# Patient Record
Sex: Male | Born: 1965 | Hispanic: Yes | Marital: Single | Smoking: Current every day smoker
Health system: Southern US, Community
[De-identification: ages and names within clinical notes are randomized; demographics above are authoritative.]

## PROBLEM LIST (undated history)

## (undated) DIAGNOSIS — E7800 Pure hypercholesterolemia, unspecified: Secondary | ICD-10-CM

## (undated) DIAGNOSIS — E119 Type 2 diabetes mellitus without complications: Secondary | ICD-10-CM

## (undated) DIAGNOSIS — I10 Essential (primary) hypertension: Secondary | ICD-10-CM

## (undated) HISTORY — PX: SHOULDER ARTHROSCOPY: SHX128

---

## 2016-04-24 DIAGNOSIS — Z6841 Body Mass Index (BMI) 40.0 and over, adult: Secondary | ICD-10-CM | POA: Insufficient documentation

## 2016-04-24 DIAGNOSIS — E119 Type 2 diabetes mellitus without complications: Secondary | ICD-10-CM | POA: Insufficient documentation

## 2016-04-24 DIAGNOSIS — E782 Mixed hyperlipidemia: Secondary | ICD-10-CM | POA: Insufficient documentation

## 2016-04-24 DIAGNOSIS — I10 Essential (primary) hypertension: Secondary | ICD-10-CM | POA: Insufficient documentation

## 2018-01-22 DIAGNOSIS — M2669 Other specified disorders of temporomandibular joint: Secondary | ICD-10-CM | POA: Insufficient documentation

## 2018-01-22 DIAGNOSIS — L299 Pruritus, unspecified: Secondary | ICD-10-CM | POA: Insufficient documentation

## 2018-01-22 DIAGNOSIS — H9203 Otalgia, bilateral: Secondary | ICD-10-CM | POA: Insufficient documentation

## 2020-01-14 ENCOUNTER — Other Ambulatory Visit: Payer: Self-pay

## 2020-01-14 ENCOUNTER — Encounter (HOSPITAL_COMMUNITY): Payer: Self-pay

## 2020-01-14 ENCOUNTER — Emergency Department (HOSPITAL_COMMUNITY): Payer: Self-pay

## 2020-01-14 ENCOUNTER — Emergency Department (HOSPITAL_COMMUNITY)
Admission: EM | Admit: 2020-01-14 | Discharge: 2020-01-14 | Payer: Self-pay | Attending: Emergency Medicine | Admitting: Emergency Medicine

## 2020-01-14 DIAGNOSIS — R079 Chest pain, unspecified: Secondary | ICD-10-CM

## 2020-01-14 DIAGNOSIS — R0602 Shortness of breath: Secondary | ICD-10-CM | POA: Insufficient documentation

## 2020-01-14 DIAGNOSIS — Z7984 Long term (current) use of oral hypoglycemic drugs: Secondary | ICD-10-CM | POA: Insufficient documentation

## 2020-01-14 DIAGNOSIS — R0789 Other chest pain: Secondary | ICD-10-CM | POA: Insufficient documentation

## 2020-01-14 DIAGNOSIS — R739 Hyperglycemia, unspecified: Secondary | ICD-10-CM

## 2020-01-14 DIAGNOSIS — I10 Essential (primary) hypertension: Secondary | ICD-10-CM | POA: Insufficient documentation

## 2020-01-14 DIAGNOSIS — E1165 Type 2 diabetes mellitus with hyperglycemia: Secondary | ICD-10-CM | POA: Insufficient documentation

## 2020-01-14 HISTORY — DX: Pure hypercholesterolemia, unspecified: E78.00

## 2020-01-14 HISTORY — DX: Essential (primary) hypertension: I10

## 2020-01-14 HISTORY — DX: Type 2 diabetes mellitus without complications: E11.9

## 2020-01-14 LAB — BASIC METABOLIC PANEL
Anion gap: 14 (ref 5–15)
BUN: 17 mg/dL (ref 6–20)
CO2: 23 mmol/L (ref 22–32)
Calcium: 9.6 mg/dL (ref 8.9–10.3)
Chloride: 98 mmol/L (ref 98–111)
Creatinine, Ser: 0.89 mg/dL (ref 0.61–1.24)
GFR calc Af Amer: >60 mL/min (ref >60)
GFR calc non Af Amer: >60 mL/min (ref >60)
Glucose, Bld: 312 mg/dL — ABNORMAL HIGH (ref 70–99)
Potassium: 4.4 mmol/L (ref 3.5–5.1)
Sodium: 135 mmol/L (ref 135–145)

## 2020-01-14 LAB — TROPONIN I (HIGH SENSITIVITY)
Troponin I (High Sensitivity): 7 ng/L (ref <18)
Troponin I (High Sensitivity): 8 ng/L (ref <18)

## 2020-01-14 LAB — CBC
HCT: 48.8 % (ref 39.0–52.0)
Hemoglobin: 16.4 g/dL (ref 13.0–17.0)
MCH: 28.9 pg (ref 26.0–34.0)
MCHC: 33.6 g/dL (ref 30.0–36.0)
MCV: 85.9 fL (ref 80.0–100.0)
Platelets: 247 10*3/uL (ref 150–400)
RBC: 5.68 MIL/uL (ref 4.22–5.81)
RDW: 14.1 % (ref 11.5–15.5)
WBC: 12.6 10*3/uL — ABNORMAL HIGH (ref 4.0–10.5)
nRBC: 0 % (ref 0.0–0.2)

## 2020-01-14 LAB — D-DIMER, QUANTITATIVE: D-Dimer, Quant: 0.29 ug/mL-FEU (ref 0.00–0.50)

## 2020-01-14 MED ORDER — SODIUM CHLORIDE 0.9 % IV BOLUS
1000.0000 mL | Freq: Once | INTRAVENOUS | Status: AC
  Administered 2020-01-14: 1000 mL via INTRAVENOUS

## 2020-01-14 MED ORDER — SODIUM CHLORIDE 0.9% FLUSH: 3.0000 mL | Freq: Once | INTRAVENOUS | Status: DC
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
## 2020-01-14 NOTE — Discharge Instructions (Addendum)
If you develop recurrent, continued, or worsening chest pain, shortness of breath, fever, vomiting, abdominal or back pain, or any other new/concerning symptoms then return to the ER for evaluation.  
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
## 2020-08-05 ENCOUNTER — Other Ambulatory Visit: Payer: Self-pay

## 2020-08-05 ENCOUNTER — Emergency Department (HOSPITAL_COMMUNITY)
Admission: EM | Admit: 2020-08-05 | Discharge: 2020-08-05 | Disposition: A | Discharge status: Home / Self Care | Payer: No Typology Code available for payment source | Attending: Emergency Medicine | Admitting: Emergency Medicine

## 2020-08-05 ENCOUNTER — Emergency Department (HOSPITAL_COMMUNITY): Payer: No Typology Code available for payment source

## 2020-08-05 ENCOUNTER — Encounter (HOSPITAL_COMMUNITY): Payer: Self-pay

## 2020-08-05 DIAGNOSIS — Y92410 Unspecified street and highway as the place of occurrence of the external cause: Secondary | ICD-10-CM | POA: Diagnosis not present

## 2020-08-05 DIAGNOSIS — R0789 Other chest pain: Secondary | ICD-10-CM | POA: Diagnosis not present

## 2020-08-05 DIAGNOSIS — I10 Essential (primary) hypertension: Secondary | ICD-10-CM | POA: Diagnosis not present

## 2020-08-05 DIAGNOSIS — E119 Type 2 diabetes mellitus without complications: Secondary | ICD-10-CM | POA: Insufficient documentation

## 2020-08-05 DIAGNOSIS — R109 Unspecified abdominal pain: Secondary | ICD-10-CM | POA: Diagnosis not present

## 2020-08-05 DIAGNOSIS — M5136 Other intervertebral disc degeneration, lumbar region: Secondary | ICD-10-CM | POA: Insufficient documentation

## 2020-08-05 DIAGNOSIS — M79605 Pain in left leg: Secondary | ICD-10-CM | POA: Diagnosis not present

## 2020-08-05 DIAGNOSIS — M5030 Other cervical disc degeneration, unspecified cervical region: Secondary | ICD-10-CM

## 2020-08-05 DIAGNOSIS — M79604 Pain in right leg: Secondary | ICD-10-CM | POA: Diagnosis not present

## 2020-08-05 DIAGNOSIS — M542 Cervicalgia: Secondary | ICD-10-CM | POA: Diagnosis present

## 2020-08-05 LAB — I-STAT CHEM 8, ED
BUN: 20 mg/dL (ref 6–20)
Calcium, Ion: 1.23 mmol/L (ref 1.15–1.40)
Chloride: 104 mmol/L (ref 98–111)
Creatinine, Ser: 0.7 mg/dL (ref 0.61–1.24)
Glucose, Bld: 138 mg/dL — ABNORMAL HIGH (ref 70–99)
HCT: 41 % (ref 39.0–52.0)
Hemoglobin: 13.9 g/dL (ref 13.0–17.0)
Potassium: 3.8 mmol/L (ref 3.5–5.1)
Sodium: 141 mmol/L (ref 135–145)
TCO2: 27 mmol/L (ref 22–32)

## 2020-08-05 LAB — COMPREHENSIVE METABOLIC PANEL
ALT: 17 U/L (ref 0–44)
AST: 16 U/L (ref 15–41)
Albumin: 4.1 g/dL (ref 3.5–5.0)
Alkaline Phosphatase: 63 U/L (ref 38–126)
Anion gap: 9 (ref 5–15)
BUN: 21 mg/dL — ABNORMAL HIGH (ref 6–20)
CO2: 27 mmol/L (ref 22–32)
Calcium: 9.3 mg/dL (ref 8.9–10.3)
Chloride: 105 mmol/L (ref 98–111)
Creatinine, Ser: 0.79 mg/dL (ref 0.61–1.24)
GFR, Estimated: >60 mL/min (ref >60)
Glucose, Bld: 141 mg/dL — ABNORMAL HIGH (ref 70–99)
Potassium: 4 mmol/L (ref 3.5–5.1)
Sodium: 141 mmol/L (ref 135–145)
Total Bilirubin: 0.6 mg/dL (ref 0.3–1.2)
Total Protein: 7.3 g/dL (ref 6.5–8.1)

## 2020-08-05 LAB — CBC
HCT: 42.6 % (ref 39.0–52.0)
Hemoglobin: 14.4 g/dL (ref 13.0–17.0)
MCH: 30 pg (ref 26.0–34.0)
MCHC: 33.8 g/dL (ref 30.0–36.0)
MCV: 88.8 fL (ref 80.0–100.0)
Platelets: 243 10*3/uL (ref 150–400)
RBC: 4.8 MIL/uL (ref 4.22–5.81)
RDW: 14 % (ref 11.5–15.5)
WBC: 10.5 10*3/uL (ref 4.0–10.5)
nRBC: 0 % (ref 0.0–0.2)

## 2020-08-05 LAB — LACTIC ACID, PLASMA: Lactic Acid, Venous: 1.4 mmol/L (ref 0.5–1.9)

## 2020-08-05 LAB — SAMPLE TO BLOOD BANK

## 2020-08-05 LAB — PROTIME-INR
INR: 0.9 (ref 0.8–1.2)
Prothrombin Time: 12 seconds (ref 11.4–15.2)

## 2020-08-05 LAB — ETHANOL: Alcohol, Ethyl (B): <10 mg/dL (ref <10)

## 2020-08-05 MED ORDER — CYCLOBENZAPRINE HCL 10 MG PO TABS: 10.0000 mg | ORAL_TABLET | Freq: Three times a day (TID) | ORAL | 0 refills | Status: DC | PRN

## 2020-08-05 MED ORDER — SODIUM CHLORIDE (PF) 0.9 % IJ SOLN
INTRAMUSCULAR | Status: AC
  Filled 2020-08-05: qty 50

## 2020-08-05 MED ORDER — FENTANYL CITRATE (PF) 100 MCG/2ML IJ SOLN
100.0000 ug | Freq: Once | INTRAMUSCULAR | Status: AC
  Administered 2020-08-05: 100 ug via INTRAVENOUS
  Filled 2020-08-05: qty 2

## 2020-08-05 MED ORDER — IOHEXOL 300 MG/ML  SOLN
100.0000 mL | Freq: Once | INTRAMUSCULAR | Status: AC | PRN
  Administered 2020-08-05: 100 mL via INTRAVENOUS

## 2020-08-05 NOTE — ED Notes (Signed)
C-collar applied in triage.

## 2020-08-05 NOTE — Discharge Instructions (Signed)
You have no traumatic injuries on your CT scans.   You do have some chronic degenerative changes in your neck/cervical spine.  You should discuss these finding with a neurosurgeon.  I've provided you with contact information.  Take medications as prescribed.  You can take Tylenol or Motrin for pain.  You can apply ice and heat for pain.
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
## 2021-06-18 ENCOUNTER — Ambulatory Visit (INDEPENDENT_AMBULATORY_CARE_PROVIDER_SITE_OTHER): Payer: 59 | Admitting: Medical

## 2021-06-18 ENCOUNTER — Telehealth: Payer: Self-pay | Admitting: Medical

## 2021-06-18 ENCOUNTER — Other Ambulatory Visit: Payer: Self-pay

## 2021-06-18 VITALS — BP 140/90 | HR 74 | Ht 73.0 in | Wt 287.0 lb

## 2021-06-18 DIAGNOSIS — Z125 Encounter for screening for malignant neoplasm of prostate: Secondary | ICD-10-CM | POA: Diagnosis not present

## 2021-06-18 DIAGNOSIS — F17200 Nicotine dependence, unspecified, uncomplicated: Secondary | ICD-10-CM | POA: Diagnosis not present

## 2021-06-18 DIAGNOSIS — M2550 Pain in unspecified joint: Secondary | ICD-10-CM

## 2021-06-18 DIAGNOSIS — E119 Type 2 diabetes mellitus without complications: Secondary | ICD-10-CM

## 2021-06-18 DIAGNOSIS — Z113 Encounter for screening for infections with a predominantly sexual mode of transmission: Secondary | ICD-10-CM

## 2021-06-18 DIAGNOSIS — Z0001 Encounter for general adult medical examination with abnormal findings: Secondary | ICD-10-CM | POA: Diagnosis not present

## 2021-06-18 DIAGNOSIS — R700 Elevated erythrocyte sedimentation rate: Secondary | ICD-10-CM

## 2021-06-18 DIAGNOSIS — M25522 Pain in left elbow: Secondary | ICD-10-CM | POA: Diagnosis not present

## 2021-06-18 DIAGNOSIS — Z0000 Encounter for general adult medical examination without abnormal findings: Secondary | ICD-10-CM

## 2021-06-18 DIAGNOSIS — E7849 Other hyperlipidemia: Secondary | ICD-10-CM

## 2021-06-18 DIAGNOSIS — Z1283 Encounter for screening for malignant neoplasm of skin: Secondary | ICD-10-CM

## 2021-06-18 DIAGNOSIS — I10 Essential (primary) hypertension: Secondary | ICD-10-CM | POA: Diagnosis not present

## 2021-06-18 LAB — COMPREHENSIVE METABOLIC PANEL
ALT: 15 U/L (ref 0–53)
AST: 12 U/L (ref 0–37)
Albumin: 4.2 g/dL (ref 3.5–5.2)
Alkaline Phosphatase: 75 U/L (ref 39–117)
BUN: 21 mg/dL (ref 6–23)
CO2: 28 mEq/L (ref 19–32)
Calcium: 9.7 mg/dL (ref 8.4–10.5)
Chloride: 100 mEq/L (ref 96–112)
Creatinine, Ser: 0.79 mg/dL (ref 0.40–1.50)
GFR: 99.59 mL/min (ref >60.00)
Glucose, Bld: 145 mg/dL — ABNORMAL HIGH (ref 70–99)
Potassium: 4.4 mEq/L (ref 3.5–5.1)
Sodium: 139 mEq/L (ref 135–145)
Total Bilirubin: 0.6 mg/dL (ref 0.2–1.2)
Total Protein: 7.2 g/dL (ref 6.0–8.3)

## 2021-06-18 LAB — LIPID PANEL
Cholesterol: 184 mg/dL (ref 0–200)
HDL: 39 mg/dL — ABNORMAL LOW (ref >39.00)
NonHDL: 144.76
Total CHOL/HDL Ratio: 5
Triglycerides: 218 mg/dL — ABNORMAL HIGH (ref 0.0–149.0)
VLDL: 43.6 mg/dL — ABNORMAL HIGH (ref 0.0–40.0)

## 2021-06-18 LAB — CBC WITH DIFFERENTIAL/PLATELET
Basophils Absolute: 0 10*3/uL (ref 0.0–0.1)
Basophils Relative: 0.4 % (ref 0.0–3.0)
Eosinophils Absolute: 0.3 10*3/uL (ref 0.0–0.7)
Eosinophils Relative: 4.2 % (ref 0.0–5.0)
HCT: 47 % (ref 39.0–52.0)
Hemoglobin: 15.6 g/dL (ref 13.0–17.0)
Lymphocytes Relative: 34.7 % (ref 12.0–46.0)
Lymphs Abs: 2.3 10*3/uL (ref 0.7–4.0)
MCHC: 33.2 g/dL (ref 30.0–36.0)
MCV: 88 fl (ref 78.0–100.0)
Monocytes Absolute: 0.5 10*3/uL (ref 0.1–1.0)
Monocytes Relative: 7.9 % (ref 3.0–12.0)
Neutro Abs: 3.6 10*3/uL (ref 1.4–7.7)
Neutrophils Relative %: 52.8 % (ref 43.0–77.0)
Platelets: 199 10*3/uL (ref 150.0–400.0)
RBC: 5.34 Mil/uL (ref 4.22–5.81)
RDW: 14.3 % (ref 11.5–15.5)
WBC: 6.8 10*3/uL (ref 4.0–10.5)

## 2021-06-18 LAB — PSA: PSA: 1.04 ng/mL (ref 0.10–4.00)

## 2021-06-18 LAB — HEMOGLOBIN A1C: Hgb A1c MFr Bld: 7.4 % — ABNORMAL HIGH (ref 4.6–6.5)

## 2021-06-18 LAB — C-REACTIVE PROTEIN: CRP: <1 mg/dL (ref 0.5–20.0)

## 2021-06-18 LAB — SEDIMENTATION RATE: Sed Rate: 32 mm/hr — ABNORMAL HIGH (ref 0–20)

## 2021-06-18 LAB — LDL CHOLESTEROL, DIRECT: Direct LDL: 124 mg/dL

## 2021-06-18 MED ORDER — METOPROLOL SUCCINATE ER 50 MG PO TB24: 50.0000 mg | ORAL_TABLET | Freq: Every day | ORAL | 3 refills | Status: DC

## 2021-06-18 MED ORDER — LISINOPRIL-HYDROCHLOROTHIAZIDE 20-25 MG PO TABS: 1.0000 | ORAL_TABLET | Freq: Every day | ORAL | 3 refills | Status: DC

## 2021-06-18 NOTE — Progress Notes (Signed)
Subjective:    Patient ID: Melvin Delgado, male    DOB: [birth year], 55 y.o.   MRN: [id number]  HPI  Pt in for follow up.   Pt changing providers. Last cpe/wellness about 2-3 years ago.   Pt works at [employer]. States [employer]. Pt not exercising. Eat moderate healthy. Pt smokes 5-6 cigarettes. Pt smoked for 36 years. On average 1 pack ever 2-3 days.    Pt has hx of htn and diabetes.   Pt had 3 covid vaccines in past.  Pt thinks he got a colonoscpy but he not remember when/where. Nor does he remember exact result.   For htn pt is on zestoretic 20-25 1 tab daily. Pt bp is elevated. No gross motor or sensory function deficits. Pt states sometimes bp is higher. One time190 sytolic and 110 diastolic. Last week. Had mild ha at that times. He checked various time and did go down to  176 systolic. Pt tell me he is on lisinopril hctz but has not been on metoprolol.   For diabetes pt is on metformin 850 mg bid.       Review of Systems  Constitutional:  Negative for fatigue and fever.  HENT:  Negative for congestion, drooling, ear pain, mouth sores and nosebleeds.   Respiratory:  Negative for cough, chest tightness, shortness of breath and wheezing.   Cardiovascular:  Negative for chest pain and palpitations.  Gastrointestinal:  Negative for abdominal pain, diarrhea and rectal pain.  Musculoskeletal:  Positive for arthralgias. Negative for back pain.       Handas, wrist and left elbow.  Pt states hands feel stiff and hurt in morning for a year of more.  Left elbow hurts a lot. Pain for 2 months.  Skin:  Negative for rash.  Neurological:  Negative for dizziness, weakness and headaches.  Hematological:  Negative for adenopathy. Does not bruise/bleed easily.  Psychiatric/Behavioral:  Negative for behavioral problems and confusion. The patient is not nervous/anxious.     Past Medical History:  Diagnosis Date   Diabetes mellitus without complication (HCC)    High  cholesterol    Hypertension      Social History   Socioeconomic History   Marital status: Single    Spouse name: Not on file   Number of children: Not on file   Years of education: Not on file   Highest education level: Not on file  Occupational History   Not on file  Tobacco Use   Smoking status: Not on file   Smokeless tobacco: Not on file  Substance and Sexual Activity   Alcohol use: Not on file   Drug use: Not on file   Sexual activity: Not on file  Other Topics Concern   Not on file  Social History Narrative   Not on file   Social Determinants of Health   Financial Resource Strain: Not on file  Food Insecurity: Not on file  Transportation Needs: Not on file  Physical Activity: Not on file  Stress: Not on file  Social Connections: Not on file  Intimate Partner Violence: Not on file    No past surgical history on file.  No family history on file.  No Known Allergies  Current Outpatient Medications on File Prior to Visit  Medication Sig Dispense Refill   lisinopril-hydrochlorothiazide (ZESTORETIC) 20-25 MG tablet Take by mouth.     cyclobenzaprine (FLEXERIL) 10 MG tablet Take 1 tablet (10 mg total) by mouth 3 (three) times daily as needed  for muscle spasms. 10 tablet 0   metFORMIN (GLUCOPHAGE) 850 MG tablet Take 850 mg by mouth 3 (three) times daily.     metoprolol succinate (TOPROL-XL) 50 MG 24 hr tablet Take 50 mg by mouth daily.     No current facility-administered medications on file prior to visit.    BP (!) 165/99   Pulse 74   Ht 6\' 1"  (1.854 m)   Wt 287 lb (130.2 kg)   SpO2 99%   BMI 37.87 kg/m        Objective:   Physical Exam   General Mental Status- Alert. General Appearance- Not in acute distress.   Skin Various and numerous moles all over back.  Neck Carotid Arteries- Normal color. Moisture- Normal Moisture. No carotid bruits. No JVD.  Chest and Lung Exam Auscultation: Breath  Sounds:-Normal.  Cardiovascular Auscultation:Rythm- Regular. Murmurs & Other Heart Sounds:Auscultation of the heart reveals- No Murmurs.  Abdomen Inspection:-Inspeection Normal. Palpation/Percussion:Note:No mass. Palpation and Percussion of the abdomen reveal- Non Tender, Non Distended + BS, no rebound or guarding.   Neurologic Cranial Nerve exam:- CN III-XII intact(No nystagmus), symmetric smile. Strength:- 5/5 equal and symmetric strength both upper and lower extremities.      Assessment & Plan:   Patient Instructions  For you wellness exam today I have ordered cbc, cmp, psa  lipid panel.  Flu vaccine declined.   Recommend exercise and healthy diet.  We will let you know lab results as they come in.  Follow up 7-10 days bp check or sooner if needed.  For htn I am refilling both your bp medications.   For diabetes continue metformin and getting a1c lab.   Please sign med release forms so can get date and copy of colonoscopy report and prior vaccine records.  Cuidados preventivos en los hombres de 40 a 64 aos de edad Preventive Care 8040-56 Years Old, Male Los cuidados preventivos hacen referencia a las opciones en cuanto al estilo de vida y a las visitas al mdico, las cuales pueden promover la salud y Counsellorel bienestar. Esto puede comprender lo siguiente: Un examen fsico anual. Esto tambin se conoce como visita de control de bienestar anual. Exmenes dentales y oculares de Inkstermanera regular. Vacunas. Estudios para Hospital doctordetectar ciertas enfermedades. Elecciones para un estilo de vida saludable, por ejemplo: Seguir una dieta saludable. Practicar actividad fsica con regularidad. No consumir drogas ni productos que contengan nicotina y tabaco. Limitar el consumo de bebidas alcohlicas. Qu puedo esperar para mi visita de cuidado preventivo? Examen fsico El mdico revisar lo siguiente: Diplomatic Services operational officerstatura y [place] Mountainpeso. Estos pueden usarse para calcular el IMC (ndice de masa corporal). El [facility]  es una medicin que indica si tiene un peso saludable. Frecuencia cardaca y presin arterial. Temperatura corporal. Piel para detectar manchas anormales. Asesoramiento Su mdico puede preguntarle acerca de: Problemas mdicos pasados. Antecedentes mdicos familiares. Consumo de tabaco, alcohol y drogas. Su bienestar emocional. Training and development officerBienestar en el hogar y las relaciones personales. Su actividad sexual. Hbitos de alimentacin, ejercicio y sueo. Su trabajo y [place] laboral. Acceso a armas de fuego. Qu vacunas necesito? Las vacunas se aplican a varias edades, segn un calendario. El Office Depotmdico le recomendar vacunas segn su edad, sus antecedentes mdicos, su estilo de vida y [address] factores, como los viajes o el lugar donde trabaja. Qu pruebas necesito? Anlisis de Praxairsangre Niveles de lpidos y colesterol. Estos se pueden verificar cada 5 aos, o ms a menudo, si usted tiene ms de 50 aos de edad. Anlisis de hepatitis C. Anlisis de  hepatitis B. Pruebas de deteccin Pruebas de deteccin de cncer de pulmn. Es posible que se le realice esta prueba de deteccin a partir de los 55 aos de edad, si ha fumado durante 30 aos un paquete diario y sigue fumando o dej el hbito en algn momento en los ltimos 15 aos. Examen de deteccin del cncer de prstata. Las recomendaciones variarn segn sus antecedentes familiares y Civil Service fast streamer. Examen de genitales para detectar cncer testicular o hernias. Pruebas de Airline pilot de Building services engineer. Todos los adultos a partir de los 50 aos de edad y [place] 75 aos de edad deben hacerse esta prueba de deteccin. El mdico puede recomendarle las pruebas de deteccin a partir de los 45 aos de edad si corre un mayor riesgo. Le realizarn pruebas cada 1 a 10 aos, segn los [place] y el tipo de prueba de Airline pilot. Pruebas de deteccin de la diabetes. Esto se Physiological scientist un control del azcar en la sangre (glucosa) despus de no haber comido  durante un periodo de tiempo (ayuno). Es posible que se le realice esta prueba cada 1 a 3 aos. Pruebas de enfermedades de transmisin sexual (ETS), si est en riesgo. Siga estas instrucciones en su casa: Comida y bebida  Siga una dieta que incluya frutas y verduras frescas, cereales integrales, protenas magras y productos lcteos descremados. Tome los suplementos vitamnicos y [employer] se lo haya indicado el mdico. No beba alcohol si el mdico se lo prohbe. Si bebe alcohol: Limite la cantidad que consume de 0 a 2 medidas por da. Est atento a la cantidad de alcohol que hay en las bebidas que toma. En los [address], una medida equivale a una botella de cerveza de 12 oz (355 ml), un vaso de vino de 5 oz (148 ml) o un vaso de una bebida alcohlica de alta graduacin de 1 oz (44 ml). Estilo de [employer] y las encas a diario. Cepllese los dientes a la maana y a la noche con pasta dental con fluoruro. Use hilo dental una vez al da. Mantngase activo. Haga al menos 30 minutos de ejercicio, 5 o ms [address]. No consuma ningn producto que contenga nicotina o tabaco, como cigarrillos, cigarrillos electrnicos y tabaco de Theatre manager. Si necesita ayuda para dejar de fumar, consulte al mdico. No consuma drogas. Si es sexualmente activo, practique sexo seguro. Use un condn u otra forma de proteccin para prevenir las ITS (infecciones de transmisin sexual). Si el mdico se lo indic, tome una dosis baja de aspirina diariamente a partir de los 50 aos de [place]. Encuentre formas saludables de lidiar con el estrs tales como: Meditacin, yoga o Optometrist. Lleve un diario personal. Hable con una persona confiable. Pase tiempo con amigos y familiares. Seguridad [place] siempre el cinturn de seguridad al conducir o viajar en un vehculo. No conduzca: Si ha estado bebiendo alcohol. No viaje con un conductor que ha estado bebiendo. Si est cansado o  distrado. Mientras est enviando mensajes de texto. Use un casco y otros equipos de proteccin durante las actividades deportivas. Si tiene armas de fuego en su casa, asegrese de seguir todos los procedimientos de seguridad correspondientes. Cundo volver? Acuda al mdico una vez al ao para una visita anual de control de bienestar. Pregntele al mdico con qu frecuencia debe realizarse un control de la vista y los dientes. Mantenga su esquema de vacunacin al da. Esta informacin no tiene Theme park manager el consejo del mdico. Asegrese de hacerle al  mdico cualquier pregunta que tenga. Document Revised: 09/01/2019 Document Reviewed: 09/01/2019 Elsevier Patient Education  2022 Elsevier Inc.     New pt with various medical problems. Did cpe/wellness and address complaints. Did take 47 minutes with pt.

## 2021-06-18 NOTE — Addendum Note (Signed)
Addended by: Rosita Kea on: 06/18/2021 10:49 AM   Modules accepted: Orders

## 2021-06-18 NOTE — Telephone Encounter (Signed)
LVM in spanish for pt if possible to come back today for labs- pt had appt today with provider but needed also to get a urine sample from pt, which was not done, and if pt can not come today, to schedule a lab appt at pt's convenience.

## 2021-06-18 NOTE — Patient Instructions (Addendum)
For you wellness exam today I have ordered cbc, cmp, psa  lipid panel.  Flu vaccine declined.   Recommend exercise and healthy diet.  We will let you know lab results as they come in.  Follow up 7-10 days bp check or sooner if needed.  For htn I am refilling both your bp medications.   For diabetes continue metformin and getting a1c lab.   Please sign med release forms so can get date and copy of colonoscopy report and prior vaccine records.  For joint pains will get inflammatory labs.  For left elbow pain will get xray. After review likely rever to sports medicine MD.   For smoking cessation plan to offer medication wellbutrin after next visit bp check.    Cuidados preventivos en los hombres de 40 a 64 aos de edad Preventive Care 20-61 Years Old, Male Los cuidados preventivos hacen referencia a las opciones en cuanto al estilo de vida y a las visitas al mdico, las cuales pueden promover la salud y Counsellor. Esto puede comprender lo siguiente: Un examen fsico anual. Esto tambin se conoce como visita de control de bienestar anual. Exmenes dentales y oculares de [place] regular. Vacunas. Estudios para Hospital doctor. Elecciones para un estilo de vida saludable, por ejemplo: Seguir una dieta saludable. Practicar actividad fsica con regularidad. No consumir drogas ni productos que contengan nicotina y tabaco. Limitar el consumo de bebidas alcohlicas. Qu puedo esperar para mi visita de cuidado preventivo? Examen fsico El mdico revisar lo siguiente: Diplomatic Services operational officer y [place]. Estos pueden usarse para calcular el IMC (ndice de masa corporal). El [facility] es una medicin que indica si tiene un peso saludable. Frecuencia cardaca y presin arterial. Temperatura corporal. Piel para detectar manchas anormales. Asesoramiento Su mdico puede preguntarle acerca de: Problemas mdicos pasados. Antecedentes mdicos familiares. Consumo de tabaco, alcohol y drogas. Su  bienestar emocional. Training and development officer y las relaciones personales. Su actividad sexual. Hbitos de alimentacin, ejercicio y sueo. Su trabajo y [place] laboral. Acceso a armas de fuego. Qu vacunas necesito? Las vacunas se aplican a varias edades, segn un calendario. El [employer] recomendar vacunas segn su edad, sus antecedentes mdicos, su estilo de vida y [address], como los viajes o el lugar donde trabaja. Qu pruebas necesito? Anlisis de [employer] de lpidos y colesterol. Estos se pueden verificar cada 5 aos, o ms a menudo, si usted tiene ms de 50 aos de edad. Anlisis de hepatitis C. Anlisis de hepatitis B. Pruebas de deteccin Pruebas de deteccin de cncer de pulmn. Es posible que se le realice esta prueba de deteccin a partir de los 55 aos de edad, si ha fumado durante 30 aos un paquete diario y sigue fumando o dej el hbito en algn momento en los ltimos 15 aos. Examen de deteccin del cncer de prstata. Las recomendaciones variarn segn sus antecedentes familiares y Civil Service fast streamer. Examen de genitales para detectar cncer testicular o hernias. Pruebas de Airline pilot de Building services engineer. Todos los adultos a partir de los 50 aos de edad y [place] 75 aos de edad deben hacerse esta prueba de deteccin. El mdico puede recomendarle las pruebas de deteccin a partir de los 45 aos de edad si corre un mayor riesgo. Le realizarn pruebas cada 1 a 10 aos, segn los [place] y el tipo de prueba de Airline pilot. Pruebas de deteccin de la diabetes. Esto se realiza mediante un control del azcar en la sangre (glucosa) despus de no haber comido durante un periodo de  tiempo (ayuno). Es posible que se le realice esta prueba cada 1 a 3 aos. Pruebas de enfermedades de transmisin sexual (ETS), si est en riesgo. Siga estas instrucciones en su casa: Comida y bebida  Siga una dieta que incluya frutas y verduras frescas, cereales integrales, protenas  magras y productos lcteos descremados. Tome los suplementos vitamnicos y [employer] se lo haya indicado el mdico. No beba alcohol si el mdico se lo prohbe. Si bebe alcohol: Limite la cantidad que consume de 0 a 2 medidas por da. Est atento a la cantidad de alcohol que hay en las bebidas que toma. En los [address], una medida equivale a una botella de cerveza de 12 oz (355 ml), un vaso de vino de 5 oz (148 ml) o un vaso de una bebida alcohlica de alta graduacin de 1 oz (44 ml). Estilo de [employer] y las encas a diario. Cepllese los dientes a la maana y a la noche con pasta dental con fluoruro. Use hilo dental una vez al da. Mantngase activo. Haga al menos 30 minutos de ejercicio, 5 o ms [address]. No consuma ningn producto que contenga nicotina o tabaco, como cigarrillos, cigarrillos electrnicos y tabaco de Theatre manager. Si necesita ayuda para dejar de fumar, consulte al mdico. No consuma drogas. Si es sexualmente activo, practique sexo seguro. Use un condn u otra forma de proteccin para prevenir las ITS (infecciones de transmisin sexual). Si el mdico se lo indic, tome una dosis baja de aspirina diariamente a partir de los 50 aos de [place]. Encuentre formas saludables de lidiar con el estrs tales como: Meditacin, yoga o Optometrist. Lleve un diario personal. Hable con una persona confiable. Pase tiempo con amigos y familiares. Seguridad [place] siempre el cinturn de seguridad al conducir o viajar en un vehculo. No conduzca: Si ha estado bebiendo alcohol. No viaje con un conductor que ha estado bebiendo. Si est cansado o distrado. Mientras est enviando mensajes de texto. Use un casco y otros equipos de proteccin durante las actividades deportivas. Si tiene armas de fuego en su casa, asegrese de seguir todos los procedimientos de seguridad correspondientes. Cundo volver? Acuda al mdico una vez al ao para una visita anual de control  de bienestar. Pregntele al mdico con qu frecuencia debe realizarse un control de la vista y los dientes. Mantenga su esquema de vacunacin al da. Esta informacin no tiene Theme park manager el consejo del mdico. Asegrese de hacerle al mdico cualquier pregunta que tenga. Document Revised: 09/01/2019 Document Reviewed: 09/01/2019 Elsevier Patient Education  2022 [employer].

## 2021-06-19 MED ORDER — ATORVASTATIN CALCIUM 10 MG PO TABS: 10.0000 mg | ORAL_TABLET | Freq: Every day | ORAL | 3 refills | Status: DC

## 2021-06-19 MED ORDER — SAXAGLIPTIN HCL 5 MG PO TABS: 5.0000 mg | ORAL_TABLET | Freq: Every day | ORAL | 3 refills | Status: DC

## 2021-06-19 NOTE — Addendum Note (Signed)
Addended by: Gwenevere Abbot on: 06/19/2021 09:13 AM   Modules accepted: Orders

## 2021-06-19 NOTE — Progress Notes (Signed)
Lvm (in spanish) for patient to call back about his lab resutls

## 2021-06-19 NOTE — Progress Notes (Signed)
Lvm (in spanish) for patient to call back about his lab resutls 

## 2021-06-20 LAB — ANTI-NUCLEAR AB-TITER (ANA TITER): ANA Titer 1: 1:80 {titer} — ABNORMAL HIGH

## 2021-06-20 LAB — RHEUMATOID FACTOR: Rheumatoid fact SerPl-aCnc: <14 IU/mL (ref <14)

## 2021-06-20 LAB — ANA: Anti Nuclear Antibody (ANA): POSITIVE — AB

## 2021-06-20 LAB — HIV ANTIBODY (ROUTINE TESTING W REFLEX): HIV 1&2 Ab, 4th Generation: NONREACTIVE

## 2021-06-22 ENCOUNTER — Telehealth: Payer: Self-pay

## 2021-06-22 ENCOUNTER — Telehealth: Payer: Self-pay | Admitting: Medical

## 2021-06-22 MED ORDER — DICLOFENAC SODIUM 75 MG PO TBEC: 75.0000 mg | DELAYED_RELEASE_TABLET | Freq: Two times a day (BID) | ORAL | 0 refills | Status: DC

## 2021-06-22 NOTE — Telephone Encounter (Signed)
Rx diclofenac sent to pharmacy.

## 2021-06-22 NOTE — Telephone Encounter (Signed)
Pt called in wanting to speak with you.

## 2021-06-25 NOTE — Telephone Encounter (Signed)
Patient moved from [place] scheduled to provider scheduled on 06-27-21 NV was at 9:15 provider's visit is at 9:20

## 2021-06-27 ENCOUNTER — Ambulatory Visit: Payer: 59

## 2021-06-27 ENCOUNTER — Ambulatory Visit: Payer: 59 | Admitting: Medical

## 2021-07-05 ENCOUNTER — Ambulatory Visit (INDEPENDENT_AMBULATORY_CARE_PROVIDER_SITE_OTHER): Payer: 59 | Admitting: Medical

## 2021-07-05 ENCOUNTER — Encounter: Payer: Self-pay | Admitting: Medical

## 2021-07-05 ENCOUNTER — Other Ambulatory Visit: Payer: Self-pay

## 2021-07-05 VITALS — BP 165/90 | HR 76 | Resp 18 | Ht 73.0 in | Wt 295.6 lb

## 2021-07-05 DIAGNOSIS — I10 Essential (primary) hypertension: Secondary | ICD-10-CM

## 2021-07-05 DIAGNOSIS — F17200 Nicotine dependence, unspecified, uncomplicated: Secondary | ICD-10-CM

## 2021-07-05 DIAGNOSIS — E119 Type 2 diabetes mellitus without complications: Secondary | ICD-10-CM | POA: Diagnosis not present

## 2021-07-05 DIAGNOSIS — R700 Elevated erythrocyte sedimentation rate: Secondary | ICD-10-CM

## 2021-07-05 DIAGNOSIS — M2550 Pain in unspecified joint: Secondary | ICD-10-CM

## 2021-07-05 MED ORDER — LISINOPRIL 20 MG PO TABS: 20.0000 mg | ORAL_TABLET | Freq: Every day | ORAL | 1 refills | Status: DC

## 2021-07-05 MED ORDER — TRAMADOL HCL 50 MG PO TABS
50.0000 mg | ORAL_TABLET | Freq: Three times a day (TID) | ORAL | 0 refills | Status: AC | PRN
Start: 2021-07-05 — End: 2021-07-10

## 2021-07-05 MED ORDER — GLIPIZIDE ER 5 MG PO TB24: 5.0000 mg | ORAL_TABLET | Freq: Every day | ORAL | 3 refills | Status: AC

## 2021-07-05 NOTE — Progress Notes (Signed)
Subjective:    Patient ID: Melvin Delgado, male    DOB: 01-10-1966, 55 y.o.   MRN: [id number]  HPI  Pt has wrist and hand pain for 4-6 months. Sed rate was elevated and rx'd diclofenac. Pt states daily has level 7/10 pain. Some times pain in rt elbow. Hand feel stiff in morning. I had placed referral to rheumatologist. Pt has appointment for October.   Pt is diabetic. A1c 7.4 recently. Pt is metformin and I had added onglyza. Pt was told price would be $500 per month. Pt thinks he may have been on glipizide in the past. No hx of hypolglycemia on meds per pt.  Pt bp is very high today. No cardiac or neurologic signs or symptoms. No caffeine today. Pt did smoke cigarrette this morning.  Thigh pain/numbness for years/82yr. No hip pain and no lumbar. Also some rt great toe numbness for one year.   Review of Systems  Constitutional:  Negative for chills, fatigue and fever.  Respiratory:  Negative for cough, chest tightness, shortness of breath and wheezing.   Cardiovascular:  Negative for chest pain and palpitations.  Gastrointestinal:  Negative for abdominal pain, nausea and vomiting.  Endocrine: Negative for polydipsia, polyphagia and polyuria.  Musculoskeletal:  Positive for arthralgias. Negative for back pain, myalgias and neck pain.  Skin:  Negative for rash.  Neurological:  Negative for dizziness, seizures, syncope, weakness, numbness and headaches.  Psychiatric/Behavioral:  Negative for behavioral problems and dysphoric mood.    Past Medical History:  Diagnosis Date   Diabetes mellitus without complication (HCC)    High cholesterol    Hypertension      Social History   Socioeconomic History   Marital status: Single    Spouse name: Not on file   Number of children: Not on file   Years of education: Not on file   Highest education level: Not on file  Occupational History   Not on file  Tobacco Use   Smoking status: Every Day    Packs/day: 0.50    Types: Cigarettes     Start date: 06/15/1985   Smokeless tobacco: Never  Substance and Sexual Activity   Alcohol use: Not on file   Drug use: Not on file   Sexual activity: Not on file  Other Topics Concern   Not on file  Social History Narrative   Not on file   Social Determinants of Health   Financial Resource Strain: Not on file  Food Insecurity: Not on file  Transportation Needs: Not on file  Physical Activity: Not on file  Stress: Not on file  Social Connections: Not on file  Intimate Partner Violence: Not on file    No past surgical history on file.  No family history on file.  No Known Allergies  Current Outpatient Medications on File Prior to Visit  Medication Sig Dispense Refill   atorvastatin (LIPITOR) 10 MG tablet Take 1 tablet (10 mg total) by mouth daily. 30 tablet 3   cyclobenzaprine (FLEXERIL) 10 MG tablet Take 1 tablet (10 mg total) by mouth 3 (three) times daily as needed for muscle spasms. 10 tablet 0   diclofenac (VOLTAREN) 75 MG EC tablet Take 1 tablet (75 mg total) by mouth 2 (two) times daily. 20 tablet 0   lisinopril-hydrochlorothiazide (ZESTORETIC) 20-25 MG tablet Take 1 tablet by mouth daily. 30 tablet 3   metFORMIN (GLUCOPHAGE) 850 MG tablet Take 850 mg by mouth 3 (three) times daily.     metoprolol succinate (TOPROL-XL)  50 MG 24 hr tablet Take 1 tablet (50 mg total) by mouth daily. 30 tablet 3   No current facility-administered medications on file prior to visit.    BP (!) 165/90   Pulse 76   Resp 18   Ht 6\' 1"  (1.854 m)   Wt 295 lb 9.6 oz (134.1 kg)   SpO2 94%   BMI 39.00 kg/m        Objective:   Physical Exam   General Mental Status- Alert. General Appearance- Not in acute distress.   Skin General: Color- Normal Color. Moisture- Normal Moisture.  Neck Carotid Arteries- Normal color. Moisture- Normal Moisture. No carotid bruits. No JVD.  Chest and Lung Exam Auscultation: Breath Sounds:-Normal.  Cardiovascular Auscultation:Rythm-  Regular. Murmurs & Other Heart Sounds:Auscultation of the heart reveals- No Murmurs.  Abdomen Inspection:-Inspeection Normal. Palpation/Percussion:Note:No mass. Palpation and Percussion of the abdomen reveal- Non Tender, Non Distended + BS, no rebound or guarding.   Neurologic Cranial Nerve exam:- CN III-XII intact(No nystagmus), symmetric smile. Strength:- 5/5 equal and symmetric strength both upper and lower extremities.      Assessment & Plan:   Patient Instructions  Hypertension not well controlled today.  Normal neurologic exam.  Continue lisinopril/HCTZ and adding additional lisinopril 20 mg to current dose.  Continue beta-blocker at same dose.  Check blood pressures daily at home and give me update in 5 to 7 days.  If you develop any cardiac or neurologic signs or symptoms and recommend ED evaluation.  History of diabetes with recent A1c of 7.4.  Continue metformin.  Unfortunately Onglyza was too expensive.  Prescription of glipizide sent to your pharmacy.  Rx advisement given regarding possible hypoglycemia.  Check blood sugars daily.  If symptomatic do additional check.  If any sugars less than 70 then eat food or beverage to increase sugar level.  Notify me of any low blood sugar events.  History of arthralgias with elevated sed rate.  Some response to diclofenac.  Since BP is elevated today discontinue diclofenac.  Also stop any over-the-counter NSAIDs.  Can use Tylenol presently for mild to moderate pain.  For moderate to severe pain making limited number of tramadol available.  Rx advisement given.  Rheumatology appointment in October.  Also advised/counseled on smoking cessation.  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  Follow-up in 3 weeks or sooner if needed.   November, PA-C

## 2021-07-05 NOTE — Patient Instructions (Addendum)
Hypertension not well controlled today.  Normal neurologic exam.  Continue lisinopril/HCTZ and adding additional lisinopril 20 mg to current dose.  Continue beta-blocker at same dose.  Check blood pressures daily at home and give me update in 5 to 7 days.  If you develop any cardiac or neurologic signs or symptoms and recommend ED evaluation.  History of diabetes with recent A1c of 7.4.  Continue metformin.  Unfortunately Onglyza was too expensive.  Prescription of glipizide sent to your pharmacy.  Rx advisement given regarding possible hypoglycemia.  Check blood sugars daily.  If symptomatic do additional check.  If any sugars less than 70 then eat food or beverage to increase sugar level.  Notify me of any low blood sugar events.  History of arthralgias with elevated sed rate.  Some response to diclofenac.  Since BP is elevated today discontinue diclofenac.  Also stop any over-the-counter NSAIDs.  Can use Tylenol presently for mild to moderate pain.  For moderate to severe pain making limited number of tramadol available.  Rx advisement given.  Rheumatology appointment in October.  Also advised/counseled on smoking cessation.  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 Could tramadol can help with nerve pain. May refer to sports med for lateral thigh numbness in future.  Follow-up in 3 weeks or sooner if needed.
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
## 2021-07-26 ENCOUNTER — Telehealth: Payer: Self-pay | Admitting: Medical

## 2021-07-26 ENCOUNTER — Other Ambulatory Visit: Payer: Self-pay

## 2021-07-26 ENCOUNTER — Ambulatory Visit: Payer: Self-pay

## 2021-07-26 ENCOUNTER — Encounter: Payer: Self-pay | Admitting: Internal Medicine

## 2021-07-26 ENCOUNTER — Ambulatory Visit: Payer: 59 | Admitting: Internal Medicine

## 2021-07-26 VITALS — BP 175/95 | HR 65 | Resp 16 | Ht 73.0 in | Wt 296.0 lb

## 2021-07-26 DIAGNOSIS — L910 Hypertrophic scar: Secondary | ICD-10-CM | POA: Insufficient documentation

## 2021-07-26 DIAGNOSIS — L309 Dermatitis, unspecified: Secondary | ICD-10-CM | POA: Insufficient documentation

## 2021-07-26 DIAGNOSIS — M79642 Pain in left hand: Secondary | ICD-10-CM | POA: Diagnosis not present

## 2021-07-26 DIAGNOSIS — M79641 Pain in right hand: Secondary | ICD-10-CM | POA: Diagnosis not present

## 2021-07-26 DIAGNOSIS — M25522 Pain in left elbow: Secondary | ICD-10-CM | POA: Diagnosis not present

## 2021-07-26 NOTE — Patient Instructions (Addendum)
Para la osteoartritis de Engineer, site, varios tratamientos pueden ser beneficiosos: - Los medicamentos antiinflamatorios no esteroideos orales como ibuprofeno, Training and development officer, celebrex o mobic son muy tiles para la osteoartritis, Biomedical engineer pueden causar efectos secundarios como lceras estomacales o hipertensin con el uso prolongado. Estos deben tomarse de forma intermitente o segn sea necesario, y siempre con alimentos.  - Se pueden aplicar medicamentos antiinflamatorios tpicos como diclofenaco o Voltaren en el rea afectada segn sea necesario, pero pueden ser menos efectivos que los medicamentos antiinflamatorios orales. Se pueden probar analgsicos tpicos que contengan CBD, mentol o lidocana. Se recomiendan los tratamientos que contienen capsaicina para la mano.  - Otros suplementos orales como la glucosamina condroitina que contiene tratamientos de venta libre como osteo bi-flex u otras marcas no tienen datos slidos que respalden la eficacia, pero pueden ser tiles para International aid/development worker y no tienen efectos secundarios importantes. La crcuma tiene Visual merchandiser similar a los medicamentos AINE y puede ayudar, si se toma como suplemento, no debe tomarse por encima de las dosis recomendadas.  - Los guantes de compresin pueden ser tiles para apoyar la articulacin del pulgar, especialmente si duele con ciertas actividades.  - La derivacin a terapia ocupacional puede discutir ejercicios o modificacin de actividades para mejorar los sntomas o la fuerza si es necesario.  - La inyeccin local de esteroides es una opcin si los sntomas empeoran y no se controlan con las opciones anteriores.  Codo de [place] Tennis Elbow El codo de [place] (epicondilitis lateral) es la inflamacin de los tendones en la zona externa del Alen Bleacher, cerca del codo. Los tendones son tejidos que Lawyer msculo al [employer]. Cuando tiene codo de [place], Community education officer a los tendones que se Insurance risk surveyor para Freight forwarder la  [place] y Scientist, forensic la mano [place] arriba. La inflamacin se produce en la parte inferior del hueso del brazo (hmero), en la que los tendones se conectan a los huesos (epicndilo lateral). A menudo, el codo de [place] afecta a aquellos que juegan al tenis, Biomedical engineer cualquier persona puede tener la afeccin si extiende la [place] o gira el antebrazo repetidas veces. Cules son las causas? Esta afeccin generalmente se debe a que extiende la [place] o gira el Product manager y [place] las manos de [place] reiterada. Puede ser consecuencia de la prctica de deportes o la realizacin de tareas que exigen movimientos repetitivos del Product manager. En algunos casos, la afeccin puede deberse a una lesin repentina. Qu incrementa el riesgo? Tiene ms probabilidades de tener codo de tenista si juega al tenis o practica otro deporte con raqueta. Adems, si [place] frecuentemente las manos para trabajar. Adems de aquellos que juegan al tenis, otras personas con mayor riesgo, incluyen: Usuarios de computadoras. Trabajadores de Paramedic. Trabajadores de fbricas. Msicos. Cocineros. Cajeros. Cules son los signos o sntomas? Los sntomas de esta afeccin incluyen: Dolor y sensibilidad a la palpacin en su antebrazo y la parte externa del codo. El Engineer, mining puede solo sentirse cuando use el brazo o puede sentirse todo Museum/gallery conservator. Una sensacin de ardor que comienza en la zona del codo y se extiende por el Product manager. Un agarre dbil en la mano. Cmo se diagnostica? Esta afeccin se diagnostica en funcin de sus sntomas, antecedentes mdicos y de un examen fsico. Adems, pueden tomarle radiografas o hacerle una resonancia magntica (RM) para: Confirmar el diagnstico. Buscar otros problemas. Verificar la presencia de Administrator, los msculos o los tendones. Cmo se trata? Descansar el brazo y Contractor hielo es Designer, jewellery.  El mdico tambin podr indicar: Medicamentos para reducir Chief Technology Officer y la  inflamacin. Estos pueden administrase en forma de pldora, gel tpico, o inyecciones de medicamentos con corticoesteroides (cortisona). Una correa para codo para reducir la presin sobre la zona. Fisioterapia. Este tratamiento puede incluir masajes o ejercicios, o ambos. Un dispositivo ortopdico para limitar los movimientos del codo que le causen sntomas. Si estos tratamientos no ayudan a [employer], el mdico puede recomendar una ciruga para extirpar el [employer] daado y Programme researcher, broadcasting/film/video a unir el msculo sano al [employer]. Siga estas instrucciones en su casa: Si tiene un dispositivo ortopdico o una correa: Use el dispositivo ortopdico o la correa como se lo haya indicado el mdico. Quteselos solamente como se lo haya indicado el mdico. Controle la piel alrededor del dispositivo ortopdico o la correa todos [place]. Informe al mdico acerca de cualquier inquietud. Afloje el dispositivo ortopdico si los dedos de las manos se le adormecen, siente hormigueos o se le enfran y se tornan de Research officer, trade union. Mantenga limpio el dispositivo ortopdico. Si el dispositivo ortopdico o la correa no son impermeables: No deje que se mojen. Cbralos con un envoltorio hermtico cuando tome un bao de inmersin o una ducha. Control del dolor, la rigidez y la hinchazn  Si se lo indican, aplique hielo sobre la zona de la lesin. Para hacer esto: Si tiene un dispositivo ortopdico desmontable o una correa, quteselo como se lo haya indicado el mdico. Ponga el hielo en una bolsa plstica. Coloque una toalla entre la piel y Copy. Aplique el hielo durante 20 minutos, 2 o 3 veces por da. Retire el hielo si la piel se pone de color rojo brillante. Esto es [employer]. Si no puede sentir dolor, calor o fro, tiene un mayor riesgo de que se dae la zona. Mueva los dedos con frecuencia para reducir la rigidez y la hinchazn. Actividad Descanse el codo y la [place], y evite las actividades que le causen  sntomas como se lo haya indicado el mdico. Haga los ejercicios de fisioterapia como se lo haya indicado el mdico. Si levanta un objeto, hgalo con la palma de la mano [place] arriba. Esto reduce la tensin en su codo. Estilo de vida Si el codo de [place] se debe a los deportes, revise el equipo y Manufacturing engineer de lo siguiente: Que lo [place] correctamente. Que es adecuado para usted. Si el codo de [place] se debe a su trabajo o al uso de una computadora, tmese descansos frecuentes para Sports coach. Hable con su empleador [employer] de controlar su afeccin en [place]. Instrucciones generales Use los medicamentos de venta libre y los recetados solamente como se lo haya indicado el mdico. No consuma ningn producto que contenga nicotina o tabaco. Estos productos incluyen cigarrillos, tabaco para Theatre manager y aparatos de vapeo, como los Administrator, Civil Service. Si necesita ayuda para dejar de fumar, consulte al mdico. Cumpla con todas las visitas de seguimiento. Esto es importante. Cmo se evita? Antes y despus de la actividad: Precaliente y elongue adecuadamente antes de hacer actividad fsica. Reljese y elongue despus de hacer actividad fsica. Dele al cuerpo tiempo para [employer] perodos de [place] fsica. Durante la actividad: Asegrese de usar el equipo que sea apto para usted. Si juega al tenis, dele potencia a su golpe con la parte inferior del cuerpo. Evite usar Chief Operating Officer. Mantenga un buen estado fsico, esto incluye lo siguiente: [place]. Flexibilidad. Resistencia. Haga ejercicios para fortalecer los msculos del Product manager.  Comunquese con un mdico si: Tiene un dolor que empeora o que no mejora con el [place]. Tiene adormecimiento o debilidad en el antebrazo, la mano o los dedos de la [place]. Solicita ayuda de inmediato si: El dolor es intenso. No puede mover la [place]. Resumen El codo de [place] (epicondilitis lateral) es la inflamacin de  los tendones en la zona externa del Alen Bleacher, cerca del codo. Los sntomas frecuentes incluyen dolor y sensibilidad a la palpacin en el Product manager y la parte externa del codo. Generalmente, esta afeccin se debe a que extiende la [place] o gira el Product manager y [place] las manos de [place] reiterada. El Marine scientist es, a menudo, Lawyer el brazo y Contractor hielo. Otros tratamientos pueden incluir tomar medicamentos, hacer ejercicios de fisioterapia, usar un dispositivo ortopdico o correa, o someterse a [place].

## 2021-07-30 ENCOUNTER — Ambulatory Visit (INDEPENDENT_AMBULATORY_CARE_PROVIDER_SITE_OTHER): Payer: 59 | Admitting: Medical

## 2021-07-30 ENCOUNTER — Other Ambulatory Visit: Payer: Self-pay

## 2021-07-30 VITALS — BP 130/80 | HR 78 | Resp 18 | Ht 73.0 in | Wt 294.6 lb

## 2021-07-30 DIAGNOSIS — H6690 Otitis media, unspecified, unspecified ear: Secondary | ICD-10-CM

## 2021-07-30 DIAGNOSIS — E119 Type 2 diabetes mellitus without complications: Secondary | ICD-10-CM | POA: Diagnosis not present

## 2021-07-30 DIAGNOSIS — M79671 Pain in right foot: Secondary | ICD-10-CM | POA: Diagnosis not present

## 2021-07-30 DIAGNOSIS — E782 Mixed hyperlipidemia: Secondary | ICD-10-CM

## 2021-07-30 DIAGNOSIS — B49 Unspecified mycosis: Secondary | ICD-10-CM

## 2021-07-30 DIAGNOSIS — I10 Essential (primary) hypertension: Secondary | ICD-10-CM

## 2021-07-30 DIAGNOSIS — M79672 Pain in left foot: Secondary | ICD-10-CM

## 2021-07-30 MED ORDER — NYSTATIN 100000 UNIT/GM EX CREA: 1.0000 "application " | TOPICAL_CREAM | Freq: Two times a day (BID) | CUTANEOUS | 0 refills | Status: AC

## 2021-07-30 MED ORDER — LISINOPRIL 40 MG PO TABS: 40.0000 mg | ORAL_TABLET | Freq: Every day | ORAL | 1 refills | Status: DC

## 2021-07-30 MED ORDER — FLUCONAZOLE 150 MG PO TABS: 150.0000 mg | ORAL_TABLET | Freq: Once | ORAL | 0 refills | Status: AC

## 2021-07-30 MED ORDER — CHLORTHALIDONE 25 MG PO TABS: 25.0000 mg | ORAL_TABLET | Freq: Every day | ORAL | 1 refills | Status: DC

## 2021-07-30 MED ORDER — AMOXICILLIN-POT CLAVULANATE 875-125 MG PO TABS: 1.0000 | ORAL_TABLET | Freq: Two times a day (BID) | ORAL | 0 refills | Status: DC

## 2021-07-30 NOTE — Progress Notes (Signed)
   Subjective:    Patient ID: Melvin Delgado, male    DOB: 1966-06-17, 55 y.o.   MRN: [id number]  HPI  Pt in for follow up.  Pt bp is much better than last visit.  Pt is on lisinopril 20 mg daily in am and using zestoretic 20/25  at night.  Pt is on metformin and onglyza. Eating low sugar diet.   Pt sugars have ranges 140-220 over past 5 days.  Pt has bilateral pain in feet for months. Metatarsal head area for 5 months. Pt after on feet all day.  Left ear pain for 2 weeks. No preceding nasal congestion. Mild rt ear pain  Scrotal itching. For months.   Review of Systems See hpi.    Objective:   Physical Exam  General Mental Status- Alert. General Appearance- Not in acute distress.   Skin General: Color- Normal Color. Moisture- Normal Moisture.  Neck Carotid Arteries- Normal color. Moisture- Normal Moisture. No carotid bruits. No JVD.  Chest and Lung Exam Auscultation: Breath Sounds:-Normal.  Cardiovascular Auscultation:Rythm- Regular. Murmurs & Other Heart Sounds:Auscultation of the heart reveals- No Murmurs.  Abdomen Inspection:-Inspeection Normal. Palpation/Percussion:Note:No mass. Palpation and Percussion of the abdomen reveal- Non Tender, Non Distended + BS, no rebound or guarding.    Neurologic Cranial Nerve exam:- CN III-XII intact(No nystagmus), symmetric smile. Strength:- 5/5 equal and symmetric strength both upper and lower extremities.   Heent- left ear- tm red moderate. Rt tim- mild red.  Derm- on inspection scrotum normal.    Assessment & Plan:   Patient Instructions  Bp is much better know. Stop current lisinopril and zestoretic. Will rx lisinopril higher dose 40 mg tab one time daily and chorthalidone diuretic.  For diabetes continue metformin and onglyza.  For high cholesterol continue atorvastatin.  For bilateral foot pain refer to podiatrist.  Bilateral  ears appears infected. Rx augmentin antibiotic   Follow up 10 days to recheck  ears.  Follow up 3 months to get cmp, lipid panel and a1c.   Esperanza Richters, PA-C

## 2021-07-30 NOTE — Patient Instructions (Addendum)
Bp is much better know. Stop current lisinopril and zestoretic. Will rx lisinopril higher dose 40 mg tab one time daily and chorthalidone diuretic.  For diabetes continue metformin and onglyza.  For high cholesterol continue atorvastatin.  For bilateral foot pain refer to podiatrist.  Bilateral  ears appears infected. Rx augmentin antibiotic   Follow up 10 days to recheck ears or prn  Scrotal itch is likely fungal. Rx diflucan for one day. Nyatatin cream  Follow up 3 months to get cmp, lipid panel and a1c.

## 2021-08-01 NOTE — Telephone Encounter (Signed)
Error

## 2021-08-06 ENCOUNTER — Ambulatory Visit: Payer: 59 | Admitting: Medical

## 2021-08-13 ENCOUNTER — Other Ambulatory Visit: Payer: Self-pay

## 2021-08-13 ENCOUNTER — Telehealth: Payer: Self-pay | Admitting: Medical

## 2021-08-13 ENCOUNTER — Telehealth (INDEPENDENT_AMBULATORY_CARE_PROVIDER_SITE_OTHER): Payer: 59 | Admitting: Medical

## 2021-08-13 DIAGNOSIS — U071 COVID-19: Secondary | ICD-10-CM | POA: Diagnosis not present

## 2021-08-13 MED ORDER — MOLNUPIRAVIR EUA 200MG CAPSULE: 4.0000 | ORAL_CAPSULE | Freq: Two times a day (BID) | ORAL | 0 refills | Status: AC

## 2021-08-13 NOTE — Progress Notes (Signed)
   Subjective:    Patient ID: Melvin Delgado, male    DOB: 19-Oct-1965, 55 y.o.   MRN: [id number]  HPI Virtual Visit via Telephone Note  I connected with Kerby Less on 08/13/21 at  1:40 PM EST by telephone and verified that I am speaking with the correct person using two identifiers.  Location: Patient: home Provider: office   I discussed the limitations, risks, security and privacy concerns of performing an evaluation and management service by telephone and the availability of in person appointments. I also discussed with the patient that there may be a patient responsible charge related to this service. The patient expressed understanding and agreed to proceed.  Pt had 3 covid vaccines in the past.  February 11-21-20 got booster.  Pt got covid + at his house and rechecked Saturday again positive.    History of Present Illness:   Last Wednesday had mild runny nose. Thursday had cough, ha, body aches and fever. Friday, Saturday and Sunday felt better overall but still fever. Today he states he has cough with mild sweat. When coughs get some mucus.   Pt had 3 covid vaccines in the past.  February 11-21-20 got booster.  Pt got covid + at his house and rechecked Saturday again positive.  He has no sob or wheezing.   Observations/Objective:  General- no acute distress and please.   Assessment and Plan:  Patient Instructions  COVID infection with mild to moderate symptoms in obese smoker.  Total risk for 4.  Previously vaccinated 3 times.  Describes it at a 5 or 6 of symptom onset.  Thursday is when symptoms became obvious.  Decided to go ahead and prescribe molnupiravir.  Also prescribed Flonase for nasal congestion and benzonatate for cough.  Described on how to and when to and quarantine.  Explained to notify me of worsening or changing signs or symptoms.  Particularly if worsening productive cough and chest congestion.  In that event would recommend chest x-ray and possible  antibiotic.  Not indicated presently.  Follow-up in 7 to 10 days or sooner if needed.    Follow Up Instructions:    I discussed the assessment and treatment plan with the patient. The patient was provided an opportunity to ask questions and all were answered. The patient agreed with the plan and demonstrated an understanding of the instructions.   The patient was advised to call back or seek an in-person evaluation if the symptoms worsen or if the condition fails to improve as anticipated.  I provided 20 minutes of non-face-to-face time during this encounter.   Esperanza Richters, PA-C    Review of Systems  Constitutional:  Positive for fever.  HENT:  Positive for congestion.   Respiratory:  Positive for cough. Negative for shortness of breath and wheezing.   Cardiovascular:  Negative for chest pain and palpitations.  Gastrointestinal:  Negative for abdominal pain.  Musculoskeletal:  Negative for myalgias.  Neurological:  Positive for headaches. Negative for dizziness.  Hematological:  Negative for adenopathy. Does not bruise/bleed easily.      Objective:   Physical Exam        Assessment & Plan:

## 2021-08-13 NOTE — Patient Instructions (Addendum)
COVID infection with mild to moderate symptoms in obese smoker.  Total risk for 4.  Previously vaccinated 3 times.  Describes it at a 5 or 6 of symptom onset.  Thursday is when symptoms became obvious.  Decided to go ahead and prescribe molnupiravir.  Also prescribed Flonase for nasal congestion and benzonatate for cough.  Described on how to and when to and quarantine.  Explained to notify me of worsening or changing signs or symptoms.  Particularly if worsening productive cough and chest congestion.  In that event would recommend chest x-ray and possible antibiotic.  Not indicated presently.  Advised can alternate Tylenol and ibuprofen for fever.  Follow-up in 7 to 10 days or sooner if needed.

## 2021-08-13 NOTE — Telephone Encounter (Signed)
Pt called back , spoke with pt advised per Ramon Dredge he can take his regular meds with covid medications .Marland Kitchen

## 2021-08-13 NOTE — Telephone Encounter (Deleted)
Pt called back, okayed per hannah g.

## 2021-08-13 NOTE — Telephone Encounter (Signed)
Pt called the office stating had an appt today with provider, pt forgot to ask his provider if it is ok with his new meds for covid can he continue taking his regular meds for his BP, diabetes and the other meds that he normally take, also wanted to know if he continues to take his ear infection meds also. Please advise. Pt tel [phone]

## 2021-08-27 ENCOUNTER — Encounter: Payer: Self-pay | Admitting: Podiatry

## 2021-08-27 ENCOUNTER — Ambulatory Visit (INDEPENDENT_AMBULATORY_CARE_PROVIDER_SITE_OTHER): Payer: 59

## 2021-08-27 ENCOUNTER — Ambulatory Visit (INDEPENDENT_AMBULATORY_CARE_PROVIDER_SITE_OTHER): Payer: 59 | Admitting: Podiatry

## 2021-08-27 ENCOUNTER — Other Ambulatory Visit: Payer: Self-pay

## 2021-08-27 DIAGNOSIS — M79672 Pain in left foot: Secondary | ICD-10-CM | POA: Diagnosis not present

## 2021-08-27 DIAGNOSIS — M79671 Pain in right foot: Secondary | ICD-10-CM

## 2021-08-27 DIAGNOSIS — M722 Plantar fascial fibromatosis: Secondary | ICD-10-CM | POA: Diagnosis not present

## 2021-08-27 MED ORDER — DICLOFENAC SODIUM 75 MG PO TBEC: 75.0000 mg | DELAYED_RELEASE_TABLET | Freq: Two times a day (BID) | ORAL | 2 refills | Status: DC

## 2021-08-27 MED ORDER — TRIAMCINOLONE ACETONIDE 10 MG/ML IJ SUSP
10.0000 mg | Freq: Once | INTRAMUSCULAR | Status: AC
Start: 2021-08-27 — End: 2021-08-27
  Administered 2021-08-27: 10 mg

## 2021-08-27 NOTE — Progress Notes (Signed)
Subjective:   Patient ID: Melvin Delgado, male   DOB: 55 y.o.   MRN: [id number]   HPI Patient presents stating that he has a lot of pain in his right arch and that his feet general get sore and he knows long-term he is getting need inserts.  He smokes a half a pack per day and does work 10 to 12 hours/day   Review of Systems  All other systems reviewed and are negative.      Objective:  Physical Exam Vitals and nursing note reviewed.  Constitutional:      Appearance: He is well-developed.  Pulmonary:     Effort: Pulmonary effort is normal.  Musculoskeletal:        General: Normal range of motion.  Skin:    General: Skin is warm.  Neurological:     Mental Status: He is alert.    Neurovascular status was found to be intact muscle strength was found to be adequate range of motion adequate.  Patient is noted to have inflammation pain in the right arch mid arch area inflammation fluid around this area and does have a relative cavus foot structure keratotic lesion secondary to pressure against the metatarsal heads with moderate obesity also noted     Assessment:  Patient who has stress on his feet in general with moderate obesity and structural changes with acute fasciitis right     Plan:  H&P reviewed condition sterile prep and injected the mid arch area right 3 mg Kenalog 5 mg Xylocain advised on support shoes anti-inflammatories and patient will be seen back to recheck again with patient placed on diclofenac today.  Patient will have orthotics made will be seen back 2 weeks to discuss  X-ray indicates relative high arch bilateral posterior spur no plantar spur formation noted

## 2021-08-28 ENCOUNTER — Other Ambulatory Visit: Payer: Self-pay | Admitting: Podiatry

## 2021-08-28 DIAGNOSIS — M722 Plantar fascial fibromatosis: Secondary | ICD-10-CM

## 2021-09-10 ENCOUNTER — Ambulatory Visit: Payer: 59 | Admitting: Podiatry

## 2021-12-05 ENCOUNTER — Telehealth: Payer: Self-pay | Admitting: Medical

## 2021-12-05 NOTE — Telephone Encounter (Signed)
Pt states he is having shoulder pain that comes and goes along with bp concerns. He stated his bp can ranges from 170-180. He did not want to be seen before Monday. Scheduled him, and also triaged to speak with a nurse. Please advise . ?

## 2021-12-05 NOTE — Telephone Encounter (Signed)
fyi

## 2021-12-06 NOTE — Telephone Encounter (Signed)
Patient called back and scheduled appointment for Monday. Advise of symptoms worsen needs to go to urgent care ?

## 2021-12-06 NOTE — Telephone Encounter (Signed)
Called but no answer, left detailed message about called for a Friday appointment with Melvin Delgado tomorrow. He as advised to ask for Melvin Delgado if I was gone for the day ?

## 2021-12-10 ENCOUNTER — Ambulatory Visit: Payer: Self-pay | Admitting: Medical

## 2021-12-11 ENCOUNTER — Encounter: Payer: Self-pay | Admitting: Medical

## 2021-12-11 ENCOUNTER — Ambulatory Visit (INDEPENDENT_AMBULATORY_CARE_PROVIDER_SITE_OTHER): Payer: Managed Care, Other (non HMO) | Admitting: Medical

## 2021-12-11 VITALS — BP 172/95 | HR 75 | Temp 98.0°F | Resp 18 | Ht 73.0 in | Wt 298.0 lb

## 2021-12-11 DIAGNOSIS — M25512 Pain in left shoulder: Secondary | ICD-10-CM | POA: Diagnosis not present

## 2021-12-11 DIAGNOSIS — I10 Essential (primary) hypertension: Secondary | ICD-10-CM

## 2021-12-11 MED ORDER — METOPROLOL SUCCINATE ER 50 MG PO TB24: 50.0000 mg | ORAL_TABLET | Freq: Every day | ORAL | 3 refills | Status: AC

## 2021-12-11 MED ORDER — LISINOPRIL 40 MG PO TABS: 40.0000 mg | ORAL_TABLET | Freq: Every day | ORAL | 1 refills | Status: DC

## 2021-12-11 MED ORDER — CHLORTHALIDONE 25 MG PO TABS
25.0000 mg | ORAL_TABLET | Freq: Every day | ORAL | 3 refills | Status: AC
Start: 2021-12-11 — End: ?

## 2021-12-11 MED ORDER — HYDRALAZINE HCL 50 MG PO TABS: 50.0000 mg | ORAL_TABLET | Freq: Three times a day (TID) | ORAL | 1 refills | Status: DC

## 2021-12-11 NOTE — Progress Notes (Signed)
? ?Subjective:  ? ? Patient ID: Melvin Delgado, male    DOB: August 26, 1966, 56 y.o.   MRN: [id number] ? ?HPI ?Pt in with left shoulder pain. Pain on and off. Pain is mild when has. States years ago had surgery on tendon surgery in [place] 15 years ago. States pain when has seconds or minutes. No med used for pain. Pt more noted with cold whether and sometimes depends on position sleeping. No current pain in shoulder. Pt had pain yesteday for one minute. ? ? ?No cardiac signs or symptoms with this pain in shoulder. ? ?Pt bp is very high. Pt on chlorthadlidone, lisinopril, and toprol. Still high. He has some ha daily. No gross motor or sensory function deficits. No chest pain. ? ?Pt using tylenol for ha.  ? ?At home pt states bp 180-190 systolic. ? ? ? ?Review of Systems  ?Constitutional:  Negative for chills, fatigue and fever.  ?Respiratory:  Negative for chest tightness.   ?Cardiovascular:  Negative for chest pain and palpitations.  ?Gastrointestinal:  Negative for abdominal pain.  ?Genitourinary:  Negative for flank pain, frequency, genital sores, hematuria, testicular pain and urgency.  ?Musculoskeletal:  Negative for back pain, joint swelling and neck pain.  ?Neurological:  Positive for headaches. Negative for dizziness, syncope, speech difficulty, weakness and light-headedness.  ?Hematological:  Negative for adenopathy. Does not bruise/bleed easily.  ?Psychiatric/Behavioral:  Negative for behavioral problems, confusion and decreased concentration.   ? ? ?Past Medical History:  ?Diagnosis Date  ? Diabetes mellitus without complication (HCC)   ? High cholesterol   ? Hypertension   ? ?  ?Social History  ? ?Socioeconomic History  ? Marital status: Single  ?  Spouse name: Not on file  ? Number of children: Not on file  ? Years of education: Not on file  ? Highest education level: Not on file  ?Occupational History  ? Not on file  ?Tobacco Use  ? Smoking status: Every Day  ?  Packs/day: 0.50  ?  Types: Cigarettes  ?  Start  date: 06/15/1985  ? Smokeless tobacco: Never  ?Vaping Use  ? Vaping Use: Never used  ?Substance and Sexual Activity  ? Alcohol use: Yes  ?  Comment: 1 every 2-3 weeks  ? Drug use: Never  ? Sexual activity: Not on file  ?Other Topics Concern  ? Not on file  ?Social History Narrative  ? Not on file  ? ?Social Determinants of Health  ? ?Financial Resource Strain: Not on file  ?Food Insecurity: Not on file  ?Transportation Needs: Not on file  ?Physical Activity: Not on file  ?Stress: Not on file  ?Social Connections: Not on file  ?Intimate Partner Violence: Not on file  ? ? ?Past Surgical History:  ?Procedure Laterality Date  ? SHOULDER ARTHROSCOPY Left   ? ? ?Family History  ?Problem Relation Age of Onset  ? Osteoporosis Mother   ? ? ?No Known Allergies ? ?Current Outpatient Medications on File Prior to Visit  ?Medication Sig Dispense Refill  ? atorvastatin (LIPITOR) 10 MG tablet Take 1 tablet (10 mg total) by mouth daily. 30 tablet 3  ? chlorthalidone (HYGROTON) 25 MG tablet Take 1 tablet (25 mg total) by mouth daily. 90 tablet 1  ? cyclobenzaprine (FLEXERIL) 10 MG tablet Take 1 tablet (10 mg total) by mouth 3 (three) times daily as needed for muscle spasms. 10 tablet 0  ? diclofenac (VOLTAREN) 75 MG EC tablet Take 1 tablet (75 mg total) by mouth 2 (two)  times daily. 50 tablet 2  ? glipiZIDE (GLUCOTROL XL) 5 MG 24 hr tablet Take 1 tablet (5 mg total) by mouth daily with breakfast. 30 tablet 3  ? lisinopril (ZESTRIL) 40 MG tablet Take 1 tablet (40 mg total) by mouth daily. 90 tablet 1  ? metFORMIN (GLUCOPHAGE) 850 MG tablet Take 850 mg by mouth 3 (three) times daily.    ? metoprolol succinate (TOPROL-XL) 50 MG 24 hr tablet Take 1 tablet (50 mg total) by mouth daily. 30 tablet 3  ? nystatin cream (MYCOSTATIN) Apply 1 application topically 2 (two) times daily. 30 g 0  ? ?No current facility-administered medications on file prior to visit.  ? ? ?BP (!) 184/99 (BP Location: Right Arm)   Pulse 75   Temp 98 ?F (36.7 ?C)    Resp 18   Ht 6\' 1"  (1.854 m)   Wt 298 lb (135.2 kg)   SpO2 94%   BMI 39.32 kg/m?  ?  ?   ?Objective:  ? Physical Exam ? ?General ?Mental Status- Alert. General Appearance- Not in acute distress.  ? ?Skin ?General: Color- Normal Color. Moisture- Normal Moisture. ? ?Neck ?Carotid Arteries- Normal color. Moisture- Normal Moisture. No carotid bruits. No JVD. ? ?Chest and Lung Exam ?Auscultation: ?Breath Sounds:-Normal. ? ?Cardiovascular ?Auscultation:Rythm- Regular. ?Murmurs & Other Heart Sounds:Auscultation of the heart reveals- No Murmurs. ? ?Abdomen ?Inspection:-Inspeection Normal. ?Palpation/Percussion:Note:No mass. Palpation and Percussion of the abdomen reveal- Non Tender, Non Distended + BS, no rebound or guarding. ? ? ? ?Neurologic ?Cranial Nerve exam:- CN III-XII intact(No nystagmus), symmetric smile. ?Drift Test:- No drift. ?Romberg Exam:- Negative.  ?Heal to Toe Gait exam:-Normal. ?Finger to Nose:- Normal/Intact ?Strength:- 5/5 equal and symmetric strength both upper and lower extremities.  ? ? ?   ?Assessment & Plan:  ? ?Patient Instructions  ?Htn- pt has only been using lisinopril. Not using metoprolol or chlorthalidone thought had 90 tab refill.  Will get cmp. Neuro exam normal. No deficits. Ha I think is from bp level high. Can use tylenol and well see if bp comes down. If you get any cardiac or neurologic signs/symptoms then be seen in ED. ? ? ?If bp is not coming down will need to add hydralazine. But need to see how bp is while talking all 3 meds.(Lisinopril, chlorthalidone and metoprolol.) ? ?I sent in hydralazine before you informed me that had not been taking diuretic and metoprolol. Hold off on filling hydralazine. ? ?For left shoulder pain with prior hx of surgery offered sport med but declined since pain very rare, transient and low level.  ? ?Ekg was normal sinus rhythm.. Pain left shoulder directly on palpation. Note shoulder sx hx as well. ? ?Follow up in one week or sooner if needed. ? ?   ? , PA-C  ?

## 2021-12-11 NOTE — Patient Instructions (Addendum)
Htn- pt has only been using lisinopril. Not using metoprolol or chlorthalidone thought had 90 tab refill.  Will get cmp. Neuro exam normal. No deficits. Ha I think is from bp level high. Can use tylenol and well see if bp comes down. If you get any cardiac or neurologic signs/symptoms then be seen in ED. ? ? ?If bp is not coming down will need to add hydralazine. But need to see how bp is while talking all 3 meds.(Lisinopril, chlorthalidone and metoprolol.) ? ?I sent in hydralazine before you informed me that had not been taking diuretic and metoprolol. Hold off on filling hydralazine. ? ?For left shoulder pain with prior hx of surgery offered sport med but declined since pain very rare, transient and low level.  ? ?Ekg was normal sinus rhythm.. Pain left shoulder directly on palpation. Note shoulder sx hx as well. ? ?Follow up in one week or sooner if needed. ? ? ?

## 2021-12-17 ENCOUNTER — Telehealth: Payer: Self-pay | Admitting: Medical

## 2021-12-17 ENCOUNTER — Encounter: Payer: Self-pay | Admitting: Medical

## 2021-12-17 ENCOUNTER — Ambulatory Visit (INDEPENDENT_AMBULATORY_CARE_PROVIDER_SITE_OTHER): Payer: Managed Care, Other (non HMO) | Admitting: Medical

## 2021-12-17 VITALS — BP 160/90 | HR 97 | Resp 18 | Ht 73.0 in | Wt 295.0 lb

## 2021-12-17 DIAGNOSIS — E119 Type 2 diabetes mellitus without complications: Secondary | ICD-10-CM | POA: Diagnosis not present

## 2021-12-17 DIAGNOSIS — I10 Essential (primary) hypertension: Secondary | ICD-10-CM

## 2021-12-17 DIAGNOSIS — E782 Mixed hyperlipidemia: Secondary | ICD-10-CM | POA: Diagnosis not present

## 2021-12-17 MED ORDER — ATORVASTATIN CALCIUM 10 MG PO TABS: 10.0000 mg | ORAL_TABLET | Freq: Every day | ORAL | 3 refills | Status: AC

## 2021-12-17 MED ORDER — HYDRALAZINE HCL 25 MG PO TABS: ORAL_TABLET | ORAL | 0 refills | Status: DC

## 2021-12-17 MED ORDER — VALSARTAN 160 MG PO TABS: 160.0000 mg | ORAL_TABLET | Freq: Every day | ORAL | 3 refills | Status: DC

## 2021-12-17 NOTE — Addendum Note (Signed)
Addended by: Gwenevere Abbot on: 12/17/2021 09:54 PM ? ? Modules accepted: Orders ? ?

## 2021-12-17 NOTE — Progress Notes (Unsigned)
° °  Subjective:    Patient ID: Melvin Delgado, male    DOB: 1965/12/08, 56 y.o.   MRN: [id number]  HPI  Pt in for follow up. Pt is using lisinopril 40 mg daily, metoprolol xl 50 mg daily, chlorthalidone 50 mg daily and hydralize 50 mg tid.   Pt has been using above for one week.   Before last visit one week ago he was only using lisinopril though had refills of metropolol and chlorthalidone.   Since last visit he is till reporting high bp levels 170-180 systolic. He can't remember diastolic.   Pt not using nsaids. Rare use coffee twice a week. Pt smokes pack of cigarettes every 2 days.   Pt has diabetes. Last a1c months ago was 7.4.   The 10-year ASCVD risk score (Arnett DK, et al., 2019) is: 35.9%   Values used to calculate the score:     Age: 56 years     Sex: Male     Is Non-Hispanic African American: No     Diabetic: Yes     Tobacco smoker: Yes     Systolic Blood Pressure: 160 mmHg     Is BP treated: Yes     HDL Cholesterol: 39 mg/dL     Total Cholesterol: 184 mg/dL     Review of Systems     Objective:   Physical Exam        Assessment & Plan:

## 2021-12-17 NOTE — Telephone Encounter (Signed)
Patient states Melvin Delgado told him to call back to let him know if the Hydroalazine pills have a line on the middle. Patient states they do not.  ?

## 2021-12-17 NOTE — Patient Instructions (Addendum)
Persistently very high blood pressure although on recheck today I did get a reading of 158/90.  Starting tomorrow discontinue lisinopril and will switch to valsartan 160 mg daily.  Continue metoprolol XL 50 mg daily and chlorthalidone 50 mg daily.  Recently he started hydralazine 50 mg 3 times daily.  I want you to check when you get home to see if it is scored tablet.  If so I want you to take second dose of hydralazine but increase to 1-1/2 tablet / 75 mg and then later tonight when taking third dose of hydralazine take 75 mg as well.  Going forward want you to take 75 mg hydralazine 3 times daily. ? ?Start checking blood pressure daily.  Warning your blood pressure to eventually decrease to less than 140/90. ? ?If you have motor or sensory function deficits then recommend being seen in the emergency department. ? ?Strongly recommend to stop smoking as nicotine can cause increase of blood pressure.  When your blood pressure gets down to reasonably controlled level could prescribe Wellbutrin for smoking cessation. ? ?Diabetes-check CMP and A1c today. ? ?High cholesterol-refilled your atorvastatin today. ? ?Follow-up this Thursday for blood pressure check.  If blood pressure not improving despite 4 med regimen then would refer you for cardiologist evaluation for resistant hypertension. ? ?Reminder below your cardiovascular risk score is very high.   ? ?The 10-year ASCVD risk score (Arnett DK, et al., 2019) is: 35.9% ?  Values used to calculate the score: ?    Age: 56 years ?    Sex: Male ?    Is Non-Hispanic African American: No ?    Diabetic: Yes ?    Tobacco smoker: Yes ?    Systolic Blood Pressure: 0000000 mmHg ?    Is BP treated: Yes ?    HDL Cholesterol: 39 mg/dL ?    Total Cholesterol: 184 mg/dL  ?

## 2021-12-18 LAB — COMPREHENSIVE METABOLIC PANEL
ALT: 15 U/L (ref 0–53)
AST: 12 U/L (ref 0–37)
Albumin: 4.1 g/dL (ref 3.5–5.2)
Alkaline Phosphatase: 90 U/L (ref 39–117)
BUN: 21 mg/dL (ref 6–23)
CO2: 30 mEq/L (ref 19–32)
Calcium: 9.4 mg/dL (ref 8.4–10.5)
Chloride: 97 mEq/L (ref 96–112)
Creatinine, Ser: 0.89 mg/dL (ref 0.40–1.50)
GFR: 95.73 mL/min (ref >60.00)
Glucose, Bld: 337 mg/dL — ABNORMAL HIGH (ref 70–99)
Potassium: 4.5 mEq/L (ref 3.5–5.1)
Sodium: 135 mEq/L (ref 135–145)
Total Bilirubin: 0.5 mg/dL (ref 0.2–1.2)
Total Protein: 6.8 g/dL (ref 6.0–8.3)

## 2021-12-18 LAB — HEMOGLOBIN A1C: Hgb A1c MFr Bld: 8.9 % — ABNORMAL HIGH (ref 4.6–6.5)

## 2021-12-20 ENCOUNTER — Ambulatory Visit: Payer: Managed Care, Other (non HMO) | Admitting: Medical

## 2021-12-20 ENCOUNTER — Other Ambulatory Visit: Payer: Self-pay

## 2021-12-20 MED ORDER — SITAGLIPTIN PHOSPHATE 50 MG PO TABS: 50.0000 mg | ORAL_TABLET | Freq: Every day | ORAL | 1 refills | Status: DC

## 2021-12-20 NOTE — Progress Notes (Signed)
janu 

## 2021-12-20 NOTE — Telephone Encounter (Signed)
Patient advised of new prescription and to take in addition to the 50 mg for a total of 75 mg Tid.  ? ?This conversation was in spanish ?

## 2021-12-21 ENCOUNTER — Ambulatory Visit (INDEPENDENT_AMBULATORY_CARE_PROVIDER_SITE_OTHER): Payer: Managed Care, Other (non HMO) | Admitting: Medical

## 2021-12-21 VITALS — BP 146/70 | HR 86 | Resp 18 | Ht 73.0 in | Wt 294.0 lb

## 2021-12-21 DIAGNOSIS — I10 Essential (primary) hypertension: Secondary | ICD-10-CM | POA: Diagnosis not present

## 2021-12-21 DIAGNOSIS — E782 Mixed hyperlipidemia: Secondary | ICD-10-CM | POA: Diagnosis not present

## 2021-12-21 DIAGNOSIS — E119 Type 2 diabetes mellitus without complications: Secondary | ICD-10-CM | POA: Diagnosis not present

## 2021-12-21 LAB — POCT CBG (FASTING - GLUCOSE)-MANUAL ENTRY: Glucose Fasting, POC: 413 mg/dL — AB (ref 70–99)

## 2021-12-21 NOTE — Patient Instructions (Addendum)
Hypertension-your blood pressure is finally  much better controlled.  Now when I checked blood pressure today level was 140/70.  Severely high on last visit.  Appears that you responded well to hydralazine 75 mg 3 times daily.  Continue metoprolol, chlorthalidone and valsartan as well. ? ?Diabetes-recent A1c 8.9.  Continue metformin glipizide and added Januvia just recently.  You report taking first dose of Januvia just recently.  Medical assistant checked sugar today and it was 417.  You report mild fatigue but no other signs or symptoms.  Just ate rice, chicken and drank gatorade about an hour half ago.  We had conversation regarding safest recommendation with sugars over 400 is you go to the emergency department.  You expressed reservation as you have to work at 3 PM and declined ED evaluation.  In light of this recommend strict diet as we approach the weekend.  Not to eat any carbs, drop all your sodas, juices and not to eat any foods.  Check your blood sugar at least twice daily and more frequently if you feel bad.  If your sugars are persistently higher than 400 then have to recommend to be seen in the emergency department.  Also if you feel worse go to the ED as well. ? ?Want you to follow-up Tuesday this coming week and bring me blood sugar readings.  If blood sugars are not decreasing then might need to put you on a basal insulin. ? ?Hyperlipidemia-continue current statin. ? ?Follow-up Tuesday or sooner if needed. ? ? ?

## 2021-12-21 NOTE — Progress Notes (Signed)
? ?Subjective:  ? ? Patient ID: Melvin Delgado, male    DOB: [birth year], 56 y.o.   MRN: [id number] ? ?HPI ? ? ?Pt in for follow up for htn that was very high.  ? ?He started higher dose hydralazine on wed 75 mg tid. His bp is now much improved. Pt is also on toprol xl- 50 mg daily , chlorthalidone and valsartan 160 mg daily.  ? ? ?Pt last a1c 4 days ago was 8.9. Pt just started [place] today.  He just ate rice and chicken. Drank some gatorade.  ? ?No infectious signs and symptoms.  ? ?Pt works at 3 today. ? ?Pt admits 2-3 12 oz coke and 2-3 glass of orange juice.  ? ?Review of Systems  ?Constitutional:  Negative for chills, fatigue and fever.  ?HENT:  Negative for congestion and drooling.   ?Respiratory:  Negative for cough, chest tightness, shortness of breath and wheezing.   ?Cardiovascular:  Negative for chest pain and palpitations.  ?Gastrointestinal:  Negative for abdominal pain, anal bleeding and diarrhea.  ?Genitourinary:  Negative for dysuria, flank pain, frequency, hematuria, penile pain, penile swelling and testicular pain.  ?Musculoskeletal:  Negative for arthralgias, back pain and gait problem.  ? ? ?Past Medical History:  ?Diagnosis Date  ? Diabetes mellitus without complication (HCC)   ? High cholesterol   ? Hypertension   ? ?  ?Social History  ? ?Socioeconomic History  ? Marital status: Single  ?  Spouse name: Not on file  ? Number of children: Not on file  ? Years of education: Not on file  ? Highest education level: Not on file  ?Occupational History  ? Not on file  ?Tobacco Use  ? Smoking status: Every Day  ?  Packs/day: 0.50  ?  Types: Cigarettes  ?  Start date: 06/15/1985  ? Smokeless tobacco: Never  ?Vaping Use  ? Vaping Use: Never used  ?Substance and Sexual Activity  ? Alcohol use: Yes  ?  Comment: 1 every 2-3 weeks  ? Drug use: Never  ? Sexual activity: Not on file  ?Other Topics Concern  ? Not on file  ?Social History Narrative  ? Not on file  ? ?Social Determinants of Health  ? ?Financial  Resource Strain: Not on file  ?Food Insecurity: Not on file  ?Transportation Needs: Not on file  ?Physical Activity: Not on file  ?Stress: Not on file  ?Social Connections: Not on file  ?Intimate Partner Violence: Not on file  ? ? ?Past Surgical History:  ?Procedure Laterality Date  ? SHOULDER ARTHROSCOPY Left   ? ? ?Family History  ?Problem Relation Age of Onset  ? Osteoporosis Mother   ? ? ?No Known Allergies ? ?Current Outpatient Medications on File Prior to Visit  ?Medication Sig Dispense Refill  ? atorvastatin (LIPITOR) 10 MG tablet Take 1 tablet (10 mg total) by mouth daily. 90 tablet 3  ? chlorthalidone (HYGROTON) 25 MG tablet Take 1 tablet (25 mg total) by mouth daily. 90 tablet 3  ? cyclobenzaprine (FLEXERIL) 10 MG tablet Take 1 tablet (10 mg total) by mouth 3 (three) times daily as needed for muscle spasms. 10 tablet 0  ? diclofenac (VOLTAREN) 75 MG EC tablet Take 1 tablet (75 mg total) by mouth 2 (two) times daily. 50 tablet 2  ? glipiZIDE (GLUCOTROL XL) 5 MG 24 hr tablet Take 1 tablet (5 mg total) by mouth daily with breakfast. 30 tablet 3  ? hydrALAZINE (APRESOLINE) 25 MG tablet Additional 25  mg tid to current rx of 50 mg tid. 90 tablet 0  ? hydrALAZINE (APRESOLINE) 50 MG tablet Take 1 tablet (50 mg total) by mouth 3 (three) times daily. 90 tablet 1  ? metFORMIN (GLUCOPHAGE) 850 MG tablet Take 850 mg by mouth 3 (three) times daily.    ? metoprolol succinate (TOPROL-XL) 50 MG 24 hr tablet Take 1 tablet (50 mg total) by mouth daily. 30 tablet 3  ? nystatin cream (MYCOSTATIN) Apply 1 application topically 2 (two) times daily. 30 g 0  ? sitaGLIPtin (JANUVIA) 50 MG tablet Take 1 tablet (50 mg total) by mouth daily. 90 tablet 1  ? valsartan (DIOVAN) 160 MG tablet Take 1 tablet (160 mg total) by mouth daily. 90 tablet 3  ? ?No current facility-administered medications on file prior to visit.  ? ? ?BP (!) 146/70   Pulse 86   Resp 18   Ht 6\' 1"  (1.854 m)   Wt 294 lb (133.4 kg)   SpO2 94%   BMI 38.79 kg/m?   ?  ?   ?Objective:  ? Physical Exam ? ?General ?Mental Status- Alert. General Appearance- Not in acute distress.  ? ?Skin ?General: Color- Normal Color. Moisture- Normal Moisture. ? ?Neck ?Carotid Arteries- Normal color. Moisture- Normal Moisture. No carotid bruits. No JVD. ? ?Chest and Lung Exam ?Auscultation: ?Breath Sounds:-Normal. ? ?Cardiovascular ?Auscultation:Rythm- Regular. ?Murmurs & Other Heart Sounds:Auscultation of the heart reveals- No Murmurs. ? ?Abdomen ?Inspection:-Inspeection Normal. ?Palpation/Percussion:Note:No mass. Palpation and Percussion of the abdomen reveal- Non Tender, Non Distended + BS, no rebound or guarding. ? ? ? ?Neurologic ?Cranial Nerve exam:- CN III-XII intact(No nystagmus), symmetric smile. ?Strength:- 5/5 equal and symmetric strength both upper and lower extremities.  ? ? ? ?   ?Assessment & Plan:  ? ?Patient Instructions  ?Hypertension-your blood pressure is finally  much better controlled.  Now when I checked blood pressure today level was 140/70.  Severely high on last visit.  Appears that you responded well to hydralazine 75 mg 3 times daily.  Continue metoprolol, chlorthalidone and valsartan as well. ? ?Diabetes-recent A1c 8.9.  Continue metformin glipizide and added Januvia just recently.  You report taking first dose of Januvia just recently.  Medical assistant checked sugar today and it was 417.  You report mild fatigue but no other signs or symptoms.  Just ate rice, chicken and drank gatorade about an hour half ago.  We had conversation regarding safest recommendation with sugars over 400 is you go to the emergency department.  You expressed reservation as you have to work at 3 PM and declined ED evaluation.  In light of this recommend strict diet as we approach the weekend.  Not to eat any carbs, drop all your sodas, juices and not to eat any foods.  Check your blood sugar at least twice daily and more frequently if you feel bad.  If your sugars are persistently higher  than 400 then have to recommend to be seen in the emergency department.  Also if you feel worse go to the ED as well. ? ?Want you to follow-up Tuesday this coming week and bring me blood sugar readings.  If blood sugars are not decreasing then might need to put you on a basal insulin. ? ?Hyperlipidemia-continue current statin. ? ?Follow-up Tuesday or sooner if needed. ? ?  ? ?Friday, PA-C  ?

## 2021-12-25 ENCOUNTER — Ambulatory Visit (INDEPENDENT_AMBULATORY_CARE_PROVIDER_SITE_OTHER): Payer: Managed Care, Other (non HMO) | Admitting: Medical

## 2021-12-25 ENCOUNTER — Other Ambulatory Visit (HOSPITAL_BASED_OUTPATIENT_CLINIC_OR_DEPARTMENT_OTHER): Payer: Self-pay

## 2021-12-25 ENCOUNTER — Encounter: Payer: Self-pay | Admitting: Medical

## 2021-12-25 VITALS — BP 150/90 | HR 76 | Temp 98.2°F | Resp 18 | Ht 73.0 in | Wt 296.0 lb

## 2021-12-25 DIAGNOSIS — I10 Essential (primary) hypertension: Secondary | ICD-10-CM | POA: Diagnosis not present

## 2021-12-25 DIAGNOSIS — E119 Type 2 diabetes mellitus without complications: Secondary | ICD-10-CM

## 2021-12-25 LAB — POCT CBG (FASTING - GLUCOSE)-MANUAL ENTRY: Glucose Fasting, POC: 214 mg/dL — AB (ref 70–99)

## 2021-12-25 MED ORDER — SAXAGLIPTIN HCL 5 MG PO TABS
5.0000 mg | ORAL_TABLET | Freq: Every day | ORAL | 11 refills | Status: AC
Start: 2021-12-25 — End: ?
  Filled 2021-12-25: qty 30, 30d supply, fill #0

## 2021-12-25 NOTE — Patient Instructions (Signed)
Diabetes with very high sugar in the 400 range late last week.  Over the weekend blood sugars ranging between 140-250.  This is with much improved diet, metformin and glipizide.  Januvia was extremely expensive.  Sending Onglyza prescription downstairs to our pharmacy.  Hopefully that will be much cheaper/reasonable.  Also want you to see nutritionist/diabetic educator.  Continue to check blood sugars twice daily. ? ?Blood pressure when I checked today was 150/90.  And with your machine at home you got 200/100 range.  No cardiac or neurologic signs or symptoms.  I am not sure on the accuracy of your machine.  Would recommend trying to get [employer] blood pressure cuff through [employer].  Want you to increase your hydralazine to 100 mg 3 times daily and continue other BP medications the same. ? ? ?I want you to send me update on your daily blood pressure and blood sugar levels on Monday.  Based on Monday's review will advise date of your next in office visit.  If sugars go over 400 or if blood pressure levels exceeding 170/90 let us know prior to Monday. ?

## 2021-12-25 NOTE — Progress Notes (Signed)
? ?Subjective:  ? ? Patient ID: Melvin Delgado, male    DOB: 1966-07-23, 56 y.o.   MRN: [id number] ? ?HPI ?Pt in for follow up. ? ?Pt sugars have been lower than last week when it was in 400 range. See last visit note. ? ?I had added [place] but it was to expensive. Pt sugars have 140 up until 250.  ? ?Pt still on metformin 850 mg tid and glipizide 5 mg daily. ? ?Pt last a1c 8.9. ? ?Pt has made a lot of changes to diet as I had recommended. ? ? ?Pt has htn- when I checked 150/90. Though with his machine at home he got 200/120. No cardiac or neurologic signs or symptoms. ? ?Review of Systems  ?Constitutional:  Negative for chills, fatigue and fever.  ?Respiratory:  Negative for cough, chest tightness, shortness of breath and wheezing.   ?Cardiovascular:  Negative for chest pain and palpitations.  ?Gastrointestinal:  Negative for abdominal pain, constipation and vomiting.  ?Genitourinary:  Negative for dysuria and flank pain.  ?Musculoskeletal:  Negative for back pain, joint swelling and myalgias.  ?Skin:  Negative for rash.  ?Neurological:  Negative for dizziness, numbness and headaches.  ?Hematological:  Negative for adenopathy. Does not bruise/bleed easily.  ?Psychiatric/Behavioral:  Negative for agitation, behavioral problems and dysphoric mood. The patient is not nervous/anxious and is not hyperactive.   ? ?Past Medical History:  ?Diagnosis Date  ? Diabetes mellitus without complication (HCC)   ? High cholesterol   ? Hypertension   ? ?  ?Social History  ? ?Socioeconomic History  ? Marital status: Single  ?  Spouse name: Not on file  ? Number of children: Not on file  ? Years of education: Not on file  ? Highest education level: Not on file  ?Occupational History  ? Not on file  ?Tobacco Use  ? Smoking status: Every Day  ?  Packs/day: 0.50  ?  Types: Cigarettes  ?  Start date: 06/15/1985  ? Smokeless tobacco: Never  ?Vaping Use  ? Vaping Use: Never used  ?Substance and Sexual Activity  ? Alcohol use: Yes  ?  Comment:  1 every 2-3 weeks  ? Drug use: Never  ? Sexual activity: Not on file  ?Other Topics Concern  ? Not on file  ?Social History Narrative  ? Not on file  ? ?Social Determinants of Health  ? ?Financial Resource Strain: Not on file  ?Food Insecurity: Not on file  ?Transportation Needs: Not on file  ?Physical Activity: Not on file  ?Stress: Not on file  ?Social Connections: Not on file  ?Intimate Partner Violence: Not on file  ? ? ?Past Surgical History:  ?Procedure Laterality Date  ? SHOULDER ARTHROSCOPY Left   ? ? ?Family History  ?Problem Relation Age of Onset  ? Osteoporosis Mother   ? ? ?No Known Allergies ? ?Current Outpatient Medications on File Prior to Visit  ?Medication Sig Dispense Refill  ? atorvastatin (LIPITOR) 10 MG tablet Take 1 tablet (10 mg total) by mouth daily. 90 tablet 3  ? chlorthalidone (HYGROTON) 25 MG tablet Take 1 tablet (25 mg total) by mouth daily. 90 tablet 3  ? cyclobenzaprine (FLEXERIL) 10 MG tablet Take 1 tablet (10 mg total) by mouth 3 (three) times daily as needed for muscle spasms. 10 tablet 0  ? diclofenac (VOLTAREN) 75 MG EC tablet Take 1 tablet (75 mg total) by mouth 2 (two) times daily. 50 tablet 2  ? glipiZIDE (GLUCOTROL XL) 5 MG 24  hr tablet Take 1 tablet (5 mg total) by mouth daily with breakfast. 30 tablet 3  ? hydrALAZINE (APRESOLINE) 25 MG tablet Additional 25 mg tid to current rx of 50 mg tid. 90 tablet 0  ? hydrALAZINE (APRESOLINE) 50 MG tablet Take 1 tablet (50 mg total) by mouth 3 (three) times daily. 90 tablet 1  ? metFORMIN (GLUCOPHAGE) 850 MG tablet Take 850 mg by mouth 3 (three) times daily.    ? metoprolol succinate (TOPROL-XL) 50 MG 24 hr tablet Take 1 tablet (50 mg total) by mouth daily. 30 tablet 3  ? nystatin cream (MYCOSTATIN) Apply 1 application topically 2 (two) times daily. 30 g 0  ? sitaGLIPtin (JANUVIA) 50 MG tablet Take 1 tablet (50 mg total) by mouth daily. 90 tablet 1  ? valsartan (DIOVAN) 160 MG tablet Take 1 tablet (160 mg total) by mouth daily. 90  tablet 3  ? ?No current facility-administered medications on file prior to visit.  ? ? ?BP (!) 176/89   Pulse 76   Temp 98.2 ?F (36.8 ?C)   Resp 18   Ht 6\' 1"  (1.854 m)   Wt 296 lb (134.3 kg)   SpO2 99%   BMI 39.05 kg/m?  ?  ? ?   ?Objective:  ? Physical Exam ? ?General ?Mental Status- Alert. General Appearance- Not in acute distress.  ? ?Skin ?General: Color- Normal Color. Moisture- Normal Moisture. ? ?Neck ?Carotid Arteries- Normal color. Moisture- Normal Moisture. No carotid bruits. No JVD. ? ?Chest and Lung Exam ?Auscultation: ?Breath Sounds:-Normal. ? ?Cardiovascular ?Auscultation:Rythm- Regular. ?Murmurs & Other Heart Sounds:Auscultation of the heart reveals- No Murmurs. ? ?Abdomen ?Inspection:-Inspeection Normal. ?Palpation/Percussion:Note:No mass. Palpation and Percussion of the abdomen reveal- Non Tender, Non Distended + BS, no rebound or guarding. ? ?Neurologic ?Cranial Nerve exam:- CN III-XII intact(No nystagmus), symmetric smile. ?Strength:- 5/5 equal and symmetric strength both upper and lower extremities.  ? ? ?   ?Assessment & Plan:  ? ?Patient Instructions  ?Diabetes with very high sugar in the 400 range late last week.  Over the weekend blood sugars ranging between 140-250.  This is with much improved diet, metformin and glipizide.  Januvia was extremely expensive.  Sending Onglyza prescription downstairs to our pharmacy.  Hopefully that will be much cheaper/reasonable.  Also want you to see nutritionist/diabetic educator.  Continue to check blood sugars twice daily. ? ?Blood pressure when I checked today was 150/90.  And with your machine at home you got 200/100 range.  No cardiac or neurologic signs or symptoms.  I am not sure on the accuracy of your machine.  Would recommend trying to get [employer] blood pressure cuff through .  Want you to increase your hydralazine to 100 mg 3 times daily and continue other BP medications the same. ? ? ?I want you to send me update on your daily blood  pressure and blood sugar levels on Monday.  Based on Monday's review will advise date of your next in office visit.  If sugars go over 400 or if blood pressure levels exceeding 170/90 let Saturday know prior to Monday.  ? ?[place], PA-C  ?

## 2022-03-07 ENCOUNTER — Ambulatory Visit (INDEPENDENT_AMBULATORY_CARE_PROVIDER_SITE_OTHER): Payer: Commercial Managed Care - HMO | Admitting: Medical

## 2022-03-07 ENCOUNTER — Encounter: Payer: Self-pay | Admitting: Medical

## 2022-03-07 VITALS — BP 155/90 | HR 76 | Temp 98.2°F | Resp 18 | Ht 73.0 in | Wt 295.0 lb

## 2022-03-07 DIAGNOSIS — I10 Essential (primary) hypertension: Secondary | ICD-10-CM | POA: Diagnosis not present

## 2022-03-07 DIAGNOSIS — R10A2 Flank pain, left side: Secondary | ICD-10-CM

## 2022-03-07 DIAGNOSIS — R109 Unspecified abdominal pain: Secondary | ICD-10-CM | POA: Diagnosis not present

## 2022-03-07 DIAGNOSIS — E119 Type 2 diabetes mellitus without complications: Secondary | ICD-10-CM

## 2022-03-07 DIAGNOSIS — R1012 Left upper quadrant pain: Secondary | ICD-10-CM | POA: Diagnosis not present

## 2022-03-07 LAB — POC URINALSYSI DIPSTICK (AUTOMATED)
Bilirubin, UA: NEGATIVE
Blood, UA: NEGATIVE
Glucose, UA: NEGATIVE
Ketones, UA: NEGATIVE
Leukocytes, UA: NEGATIVE
Nitrite, UA: NEGATIVE
Protein, UA: POSITIVE — AB
Spec Grav, UA: 1.025 (ref 1.010–1.025)
Urobilinogen, UA: NEGATIVE E.U./dL — AB
pH, UA: 5 (ref 5.0–8.0)

## 2022-03-07 LAB — LIPASE: Lipase: 62 U/L — ABNORMAL HIGH (ref 11.0–59.0)

## 2022-03-07 LAB — COMPREHENSIVE METABOLIC PANEL
ALT: 17 U/L (ref 0–53)
AST: 13 U/L (ref 0–37)
Albumin: 4.1 g/dL (ref 3.5–5.2)
Alkaline Phosphatase: 70 U/L (ref 39–117)
BUN: 20 mg/dL (ref 6–23)
CO2: 30 mEq/L (ref 19–32)
Calcium: 9.6 mg/dL (ref 8.4–10.5)
Chloride: 99 mEq/L (ref 96–112)
Creatinine, Ser: 0.87 mg/dL (ref 0.40–1.50)
GFR: 96.24 mL/min (ref >60.00)
Glucose, Bld: 161 mg/dL — ABNORMAL HIGH (ref 70–99)
Potassium: 4.5 mEq/L (ref 3.5–5.1)
Sodium: 139 mEq/L (ref 135–145)
Total Bilirubin: 0.7 mg/dL (ref 0.2–1.2)
Total Protein: 7 g/dL (ref 6.0–8.3)

## 2022-03-07 LAB — GLUCOSE, POCT (MANUAL RESULT ENTRY): POC Glucose: 150 mg/dl — AB (ref 70–99)

## 2022-03-07 LAB — CBC WITH DIFFERENTIAL/PLATELET
Basophils Absolute: 0.1 10*3/uL (ref 0.0–0.1)
Basophils Relative: 0.7 % (ref 0.0–3.0)
Eosinophils Absolute: 0.3 10*3/uL (ref 0.0–0.7)
Eosinophils Relative: 4.5 % (ref 0.0–5.0)
HCT: 45.4 % (ref 39.0–52.0)
Hemoglobin: 15 g/dL (ref 13.0–17.0)
Lymphocytes Relative: 31.1 % (ref 12.0–46.0)
Lymphs Abs: 2.3 10*3/uL (ref 0.7–4.0)
MCHC: 33 g/dL (ref 30.0–36.0)
MCV: 89.1 fl (ref 78.0–100.0)
Monocytes Absolute: 0.6 10*3/uL (ref 0.1–1.0)
Monocytes Relative: 7.6 % (ref 3.0–12.0)
Neutro Abs: 4.2 10*3/uL (ref 1.4–7.7)
Neutrophils Relative %: 56.1 % (ref 43.0–77.0)
Platelets: 211 10*3/uL (ref 150.0–400.0)
RBC: 5.09 Mil/uL (ref 4.22–5.81)
RDW: 14.2 % (ref 11.5–15.5)
WBC: 7.4 10*3/uL (ref 4.0–10.5)

## 2022-03-07 MED ORDER — HYDRALAZINE HCL 100 MG PO TABS: 100.0000 mg | ORAL_TABLET | Freq: Two times a day (BID) | ORAL | 0 refills | Status: DC

## 2022-03-07 MED ORDER — METRONIDAZOLE 500 MG PO TABS: 500.0000 mg | ORAL_TABLET | Freq: Three times a day (TID) | ORAL | 0 refills | Status: AC

## 2022-03-07 MED ORDER — CIPROFLOXACIN HCL 500 MG PO TABS: 500.0000 mg | ORAL_TABLET | Freq: Two times a day (BID) | ORAL | 0 refills | Status: DC

## 2022-03-07 NOTE — Progress Notes (Signed)
Subjective:    Patient ID: Melvin Delgado, male    DOB: 10/20/1965, 56 y.o.   MRN: [id number]  HPI Pt in with some left upper quadrant. Pain is constant for 5 days. Pt moderate in beginning.(Mild presently at rest) Worse when he palpates area. No back pain. Subjective fever yesterday for one hour. No chills or sweats. No vomiting, no nausea or diarrhea.   Last bm yesterday. No constipation.  Pt states he thinks maybe 4 years ago had colonoscopy. But he can't remember who did procedure?  He feels some fatigue recently. No pain on urination.  Htn- bp is high today. Pt on metoprolol xl 50 mg daily, chlorthalidone  and hydralazine 50 mg tid. When pt 170, 165 140 systolic. Most of time systolic above 140. Diastolic 80-90.  Diabetic pt sugar 146 yesterday. Today when we checked 150. Pt stats on metoformin, onglyza and glipizide.     Review of Systems  Constitutional:  Negative for chills, fatigue and fever.  Respiratory:  Negative for cough, chest tightness and wheezing.   Gastrointestinal:  Positive for abdominal pain. Negative for abdominal distention, blood in stool, nausea, rectal pain and vomiting.       Flank pain  Genitourinary:  Negative for dysuria, flank pain, frequency, penile pain, scrotal swelling, testicular pain and urgency.  Musculoskeletal:  Negative for back pain.  Skin:  Negative for rash.    Past Medical History:  Diagnosis Date   Diabetes mellitus without complication (HCC)    High cholesterol    Hypertension      Social History   Socioeconomic History   Marital status: Single    Spouse name: Not on file   Number of children: Not on file   Years of education: Not on file   Highest education level: Not on file  Occupational History   Not on file  Tobacco Use   Smoking status: Every Day    Packs/day: 0.50    Types: Cigarettes    Start date: 06/15/1985   Smokeless tobacco: Never  Vaping Use   Vaping Use: Never used  Substance and Sexual Activity    Alcohol use: Yes    Comment: 1 every 2-3 weeks   Drug use: Never   Sexual activity: Not on file  Other Topics Concern   Not on file  Social History Narrative   Not on file   Social Determinants of Health   Financial Resource Strain: Not on file  Food Insecurity: Not on file  Transportation Needs: Not on file  Physical Activity: Not on file  Stress: Not on file  Social Connections: Not on file  Intimate Partner Violence: Not on file    Past Surgical History:  Procedure Laterality Date   SHOULDER ARTHROSCOPY Left     Family History  Problem Relation Age of Onset   Osteoporosis Mother     No Known Allergies  Current Outpatient Medications on File Prior to Visit  Medication Sig Dispense Refill   atorvastatin (LIPITOR) 10 MG tablet Take 1 tablet (10 mg total) by mouth daily. 90 tablet 3   chlorthalidone (HYGROTON) 25 MG tablet Take 1 tablet (25 mg total) by mouth daily. 90 tablet 3   cyclobenzaprine (FLEXERIL) 10 MG tablet Take 1 tablet (10 mg total) by mouth 3 (three) times daily as needed for muscle spasms. 10 tablet 0   diclofenac (VOLTAREN) 75 MG EC tablet Take 1 tablet (75 mg total) by mouth 2 (two) times daily. 50 tablet 2   glipiZIDE (  GLUCOTROL XL) 5 MG 24 hr tablet Take 1 tablet (5 mg total) by mouth daily with breakfast. 30 tablet 3   hydrALAZINE (APRESOLINE) 25 MG tablet Additional 25 mg tid to current rx of 50 mg tid. 90 tablet 0   hydrALAZINE (APRESOLINE) 50 MG tablet Take 1 tablet (50 mg total) by mouth 3 (three) times daily. 90 tablet 1   metFORMIN (GLUCOPHAGE) 850 MG tablet Take 850 mg by mouth 3 (three) times daily.     metoprolol succinate (TOPROL-XL) 50 MG 24 hr tablet Take 1 tablet (50 mg total) by mouth daily. 30 tablet 3   nystatin cream (MYCOSTATIN) Apply 1 application topically 2 (two) times daily. 30 g 0   saxagliptin HCl (ONGLYZA) 5 MG TABS tablet Take 1 tablet (5 mg total) by mouth daily. 30 tablet 11   valsartan (DIOVAN) 160 MG tablet Take 1 tablet  (160 mg total) by mouth daily. 90 tablet 3   No current facility-administered medications on file prior to visit.    BP (!) 155/90   Pulse 76   Temp 98.2 F (36.8 C)   Resp 18   Ht 6\' 1"  (1.854 m)   Wt 295 lb (133.8 kg)   SpO2 97%   BMI 38.92 kg/m       Objective:   Physical Exam  General Mental Status- Alert. General Appearance- Not in acute distress.    Neck Carotid Arteries- Normal color. Moisture- Normal Moisture. No carotid bruits. No JVD.  Chest and Lung Exam Auscultation: Breath Sounds:-Normal.  Cardiovascular Auscultation:Rythm- Regular. Murmurs & Other Heart Sounds:Auscultation of the heart reveals- No Murmurs.  Abdomen Inspection:-Inspeection Normal. Palpation/Percussion:Note:No mass. Palpation and Percussion of the abdomen reveal- mild to moderate luq and left flank Tender, Non Distended + BS, no rebound or guarding.    Neurologic Cranial Nerve exam:- CN III-XII intact(No nystagmus), symmetric smile. Strength:- 5/5 equal and symmetric strength both upper and lower extremities.   Back- no cva tenderness. Skin- no rashes on abdomen/flank.    Assessment & Plan:   Patient Instructions  Recent mild to moderate left flank and left upper quadrant pain for 5 days.  Presently pain is mild but easily induced on exam/palpation.  No CVA/kidney area tenderness, no rash on skin and urine clear except for some protein.  I will get CBC, CMP and a lipase.  Prescribing Cipro antibiotic and Flagyl to cover for potential diverticulitis.  On review I do not see any records of prior colonoscopy but you think you had one done 4 years ago.  Please try to remember where you got that done as you might need repeat in the near future.  Particularly if your abdomen pain persist might refer you to GI MD.  Hypertension-blood pressure high today and elevated when you check at home.  Continue metoprolol 50 mg daily, chlorthalidone and will increase your hydralazine to 100 mg twice  daily.  For diabetes continue with current diabetic meds.  After I reviewed labs done today we will place future A1c/62-month sugar average test.  If your abdomen pain worsens or changes let 2-month know.  If any severe type pain recommend emergency department evaluation as in that event you may need CT imaging.  Follow-up in 7 days or sooner if needed.   [place], PA-C   Time spent with patient today was  41 minutes which consisted of chart review, discussing diagnosis, work up treatment and documentation.

## 2022-03-07 NOTE — Patient Instructions (Addendum)
Recent mild to moderate left flank and left upper quadrant pain for 5 days.  Presently pain is mild but easily induced on exam/palpation.  No CVA/kidney area tenderness, no rash on skin and urine clear except for some protein.  I will get CBC, CMP and a lipase.  Prescribing Cipro antibiotic and Flagyl to cover for potential diverticulitis.  On review I do not see any records of prior colonoscopy but you think you had one done 4 years ago.  Please try to remember where you got that done as you might need repeat in the near future.  Particularly if your abdomen pain persist might refer you to GI MD.  Hypertension-blood pressure high today and elevated when you check at home.  Continue metoprolol 50 mg daily, chlorthalidone and will increase your hydralazine to 100 mg twice daily.  For diabetes continue with current diabetic meds.  After I reviewed labs done today we will place future A1c/17-month sugar average test.  If your abdomen pain worsens or changes let us know.  If any severe type pain recommend emergency department evaluation as in that event you may need CT imaging.  Follow-up in 7 days or sooner if needed.

## 2022-03-14 ENCOUNTER — Ambulatory Visit (INDEPENDENT_AMBULATORY_CARE_PROVIDER_SITE_OTHER): Payer: Commercial Managed Care - HMO | Admitting: Medical

## 2022-03-14 ENCOUNTER — Encounter: Payer: Self-pay | Admitting: Medical

## 2022-03-14 VITALS — BP 140/80 | HR 67 | Resp 20 | Ht 73.0 in | Wt 290.6 lb

## 2022-03-14 DIAGNOSIS — I10 Essential (primary) hypertension: Secondary | ICD-10-CM | POA: Diagnosis not present

## 2022-03-14 DIAGNOSIS — R1012 Left upper quadrant pain: Secondary | ICD-10-CM | POA: Diagnosis not present

## 2022-03-14 DIAGNOSIS — R1032 Left lower quadrant pain: Secondary | ICD-10-CM

## 2022-03-14 LAB — LIPASE: Lipase: 58 U/L (ref 11.0–59.0)

## 2022-03-14 MED ORDER — METRONIDAZOLE 500 MG PO TABS: 500.0000 mg | ORAL_TABLET | Freq: Three times a day (TID) | ORAL | 0 refills | Status: AC

## 2022-03-14 MED ORDER — CIPROFLOXACIN HCL 500 MG PO TABS: 500.0000 mg | ORAL_TABLET | Freq: Two times a day (BID) | ORAL | 0 refills | Status: DC

## 2022-03-14 NOTE — Progress Notes (Signed)
Subjective:    Patient ID: Melvin Delgado, male    DOB: May 01, 1966, 56 y.o.   MRN: [id number]  HPI  Pt in for follow up.  Pt still has left flank/sidearea pain. Urine study was negative. No wbc elevation. Treated with cipro and flagyl.  Pain level is 2/10. Before pain was 6-7/10.  Has 2 more days left of antibiotic.  Pt had colonoscopy done 4 years ago. He does not remember where it was done.   Htn- pt blood pressure borderline controlled today.     Review of Systems  Constitutional:  Negative for chills, fatigue and fever.  Respiratory:  Negative for cough, chest tightness, shortness of breath and wheezing.   Cardiovascular:  Negative for chest pain and palpitations.  Gastrointestinal:  Positive for abdominal pain.  Genitourinary:  Negative for dysuria.  Musculoskeletal:  Negative for back pain and myalgias.  Skin:  Negative for rash.  Neurological:  Negative for dizziness, speech difficulty, weakness, numbness and headaches.  Psychiatric/Behavioral:  Negative for behavioral problems and decreased concentration.     Past Medical History:  Diagnosis Date   Diabetes mellitus without complication (HCC)    High cholesterol    Hypertension      Social History   Socioeconomic History   Marital status: Single    Spouse name: Not on file   Number of children: Not on file   Years of education: Not on file   Highest education level: Not on file  Occupational History   Not on file  Tobacco Use   Smoking status: Every Day    Packs/day: 0.50    Types: Cigarettes    Start date: 06/15/1985   Smokeless tobacco: Never  Vaping Use   Vaping Use: Never used  Substance and Sexual Activity   Alcohol use: Yes    Comment: 1 every 2-3 weeks   Drug use: Never   Sexual activity: Not on file  Other Topics Concern   Not on file  Social History Narrative   Not on file   Social Determinants of Health   Financial Resource Strain: Not on file  Food Insecurity: Not on file   Transportation Needs: Not on file  Physical Activity: Not on file  Stress: Not on file  Social Connections: Not on file  Intimate Partner Violence: Not on file    Past Surgical History:  Procedure Laterality Date   SHOULDER ARTHROSCOPY Left     Family History  Problem Relation Age of Onset   Osteoporosis Mother     No Known Allergies  Current Outpatient Medications on File Prior to Visit  Medication Sig Dispense Refill   atorvastatin (LIPITOR) 10 MG tablet Take 1 tablet (10 mg total) by mouth daily. 90 tablet 3   chlorthalidone (HYGROTON) 25 MG tablet Take 1 tablet (25 mg total) by mouth daily. 90 tablet 3   ciprofloxacin (CIPRO) 500 MG tablet Take 1 tablet (500 mg total) by mouth 2 (two) times daily. 14 tablet 0   cyclobenzaprine (FLEXERIL) 10 MG tablet Take 1 tablet (10 mg total) by mouth 3 (three) times daily as needed for muscle spasms. 10 tablet 0   diclofenac (VOLTAREN) 75 MG EC tablet Take 1 tablet (75 mg total) by mouth 2 (two) times daily. 50 tablet 2   glipiZIDE (GLUCOTROL XL) 5 MG 24 hr tablet Take 1 tablet (5 mg total) by mouth daily with breakfast. 30 tablet 3   hydrALAZINE (APRESOLINE) 100 MG tablet Take 1 tablet (100 mg total) by mouth  2 (two) times daily. 60 tablet 0   metFORMIN (GLUCOPHAGE) 850 MG tablet Take 850 mg by mouth 3 (three) times daily.     metoprolol succinate (TOPROL-XL) 50 MG 24 hr tablet Take 1 tablet (50 mg total) by mouth daily. 30 tablet 3   metroNIDAZOLE (FLAGYL) 500 MG tablet Take 1 tablet (500 mg total) by mouth 3 (three) times daily for 7 days. 21 tablet 0   nystatin cream (MYCOSTATIN) Apply 1 application topically 2 (two) times daily. 30 g 0   saxagliptin HCl (ONGLYZA) 5 MG TABS tablet Take 1 tablet (5 mg total) by mouth daily. 30 tablet 11   valsartan (DIOVAN) 160 MG tablet Take 1 tablet (160 mg total) by mouth daily. 90 tablet 3   No current facility-administered medications on file prior to visit.    BP 140/80   Pulse 67   Resp 20    Ht 6\' 1"  (1.854 m)   Wt 290 lb 9.6 oz (131.8 kg)   SpO2 99%   BMI 38.34 kg/m        Objective:   Physical Exam   General- No acute distress. Pleasant patient. Neck- Full range of motion, no jvd Lungs- Clear, even and unlabored. Heart- regular rate and rhythm. Neurologic- CNII- XII grossly intact.  Abdomen- left lower quadrant and upper quadrant area tender to palpation. Mild-moderate on palpation. Back- no cva pain.      Assessment & Plan:   Patient Instructions  Some partial response to tx of possible diverticulitis. Since pain persists providing 3 days more of antibiotics and ording ct abd/pelvis with contrast.  Repeating lipase today to see if now normal.  Htn- continue bp meds.  Updating your tdap. Recommend ask your employer if you can see workers comp doctor for rt thumb laceration. Or if they want to see.  Follow up 6 days or sooner if needed   [place], PA-C

## 2022-03-14 NOTE — Patient Instructions (Signed)
Some partial response to tx of possible diverticulitis. Since pain persists providing 3 days more of antibiotics and ording ct abd/pelvis with contrast.  Repeating lipase today to see if now normal.  Htn- continue bp meds.  Updating your tdap. Recommend ask your employer if you can see workers comp doctor for rt thumb laceration. Or if they want [place] to see.  Follow up 6 days or sooner if needed

## 2022-03-25 ENCOUNTER — Encounter (HOSPITAL_BASED_OUTPATIENT_CLINIC_OR_DEPARTMENT_OTHER): Payer: Self-pay

## 2022-03-25 ENCOUNTER — Ambulatory Visit (HOSPITAL_BASED_OUTPATIENT_CLINIC_OR_DEPARTMENT_OTHER)
Admission: RE | Admit: 2022-03-25 | Discharge: 2022-03-25 | Disposition: A | Discharge status: Home / Self Care | Payer: Commercial Managed Care - HMO | Source: Ambulatory Visit | Attending: Medical | Admitting: Medical

## 2022-03-25 DIAGNOSIS — R1032 Left lower quadrant pain: Secondary | ICD-10-CM | POA: Diagnosis not present

## 2022-03-25 DIAGNOSIS — R1012 Left upper quadrant pain: Secondary | ICD-10-CM | POA: Insufficient documentation

## 2022-03-25 MED ORDER — IOHEXOL 300 MG/ML  SOLN
100.0000 mL | Freq: Once | INTRAMUSCULAR | Status: AC | PRN
Start: 2022-03-25 — End: 2022-03-25
  Administered 2022-03-25: 100 mL via INTRAVENOUS

## 2022-04-04 ENCOUNTER — Encounter: Payer: Self-pay | Admitting: Medical

## 2022-04-04 ENCOUNTER — Ambulatory Visit (INDEPENDENT_AMBULATORY_CARE_PROVIDER_SITE_OTHER): Payer: Commercial Managed Care - HMO | Admitting: Medical

## 2022-04-04 VITALS — BP 137/80 | HR 84 | Resp 18 | Ht 73.0 in | Wt 295.0 lb

## 2022-04-04 DIAGNOSIS — I10 Essential (primary) hypertension: Secondary | ICD-10-CM | POA: Diagnosis not present

## 2022-04-04 DIAGNOSIS — L989 Disorder of the skin and subcutaneous tissue, unspecified: Secondary | ICD-10-CM

## 2022-04-04 DIAGNOSIS — Z113 Encounter for screening for infections with a predominantly sexual mode of transmission: Secondary | ICD-10-CM

## 2022-04-04 DIAGNOSIS — E119 Type 2 diabetes mellitus without complications: Secondary | ICD-10-CM | POA: Diagnosis not present

## 2022-04-04 DIAGNOSIS — M2550 Pain in unspecified joint: Secondary | ICD-10-CM

## 2022-04-04 LAB — SEDIMENTATION RATE: Sed Rate: 25 mm/hr — ABNORMAL HIGH (ref 0–20)

## 2022-04-04 LAB — COMPREHENSIVE METABOLIC PANEL
ALT: 15 U/L (ref 0–53)
AST: 11 U/L (ref 0–37)
Albumin: 4 g/dL (ref 3.5–5.2)
Alkaline Phosphatase: 84 U/L (ref 39–117)
BUN: 16 mg/dL (ref 6–23)
CO2: 30 mEq/L (ref 19–32)
Calcium: 9 mg/dL (ref 8.4–10.5)
Chloride: 100 mEq/L (ref 96–112)
Creatinine, Ser: 0.74 mg/dL (ref 0.40–1.50)
GFR: 101.01 mL/min (ref >60.00)
Glucose, Bld: 218 mg/dL — ABNORMAL HIGH (ref 70–99)
Potassium: 4.3 mEq/L (ref 3.5–5.1)
Sodium: 137 mEq/L (ref 135–145)
Total Bilirubin: 0.4 mg/dL (ref 0.2–1.2)
Total Protein: 6.8 g/dL (ref 6.0–8.3)

## 2022-04-04 LAB — HEMOGLOBIN A1C: Hgb A1c MFr Bld: 8.5 % — ABNORMAL HIGH (ref 4.6–6.5)

## 2022-04-04 LAB — URIC ACID: Uric Acid, Serum: 5.6 mg/dL (ref 4.0–7.8)

## 2022-04-04 LAB — C-REACTIVE PROTEIN: CRP: <1 mg/dL (ref 0.5–20.0)

## 2022-04-04 MED ORDER — FAMCICLOVIR 500 MG PO TABS: 500.0000 mg | ORAL_TABLET | Freq: Three times a day (TID) | ORAL | 0 refills | Status: DC

## 2022-04-04 MED ORDER — DOXYCYCLINE HYCLATE 100 MG PO TABS: 100.0000 mg | ORAL_TABLET | Freq: Two times a day (BID) | ORAL | 0 refills | Status: DC

## 2022-04-04 NOTE — Progress Notes (Signed)
   Subjective:    Patient ID: Melvin Delgado, male    DOB: [birth year], 56 y.o.   MRN: [id number]  HPI  Pt in for evaluation.   He has some mild hand pain on and off. Pain for years. He describes more in thenar imminens area. But also pain in shoulder and elbows. Mild occasional pain in knees.  He states really came in more for  small bumps on penis. Had for 2 days. He states had these before in past. He states will get this twice a year. He states first time he got the bumps had a lot burning. First time happened 7-8 years ago.  Not currently sexually active/for some time per his description.    Review of Systems See hpi .    Objective:   Physical Exam   General- No acute distress. Pleasant patient. Neck- Full range of motion, no jvd Lungs- Clear, even and unlabored. Heart- regular rate and rhythm. Neurologic- CNII- XII grossly intact.  Genital- distal shaft of penis below gland on left side 3 small bumps. Mild tender but not vesicular. Head of penis is clear. Pt uncircumcised.  Shoulders-mild tenderness on range of motion. Elbows-not swollen.  Normal range of motion with no tenderness presently. Hands-no arthritic changes or swelling presently. Knees good range of motion with no tenderness.    Assessment & Plan:   Patient Instructions  Arthralgias-shoulders, elbows hands and sometimes in knees.  We will get inflammatory lab studies.  For pain recommending low-dose ibuprofen 200 mg every 8 hours as needed.  Also could combine this with Tylenol.  Recommending low-dose NSAID since you have had issues with blood pressure in the past.  NSAIDs can increase blood pressure.  Hypertension-blood pressure better controlled than in the past.  Continue valsartan and chlorthalidone.  Diabetes-we will get A1c and metabolic panel today.  Bumps on shaft of penis.  By history given could be herpes type II but on inspection the areas do not look vesicular.  Day 3 of current symptoms so we  will prescribe Famvir antiviral pending HSV 2 antibody.  Considering folliculitis also in differential diagnosis.  If areas do not respond to Famvir then can add on doxycycline oral antibiotic.  Note patient never described ulcer formation.  Also not sexually active and declines other STD Test.  Follow-up 3 weeks or sooner if needed.   Esperanza Richters, PA-C

## 2022-04-04 NOTE — Patient Instructions (Addendum)
Arthralgias-shoulders, elbows hands and sometimes in knees.  We will get inflammatory lab studies.  For pain recommending low-dose ibuprofen 200 mg every 8 hours as needed.  Also could combine this with Tylenol.  Recommending low-dose NSAID since you have had issues with blood pressure in the past.  NSAIDs can increase blood pressure.  Hypertension-blood pressure better controlled than in the past.  Continue valsartan and chlorthalidone.  Diabetes-we will get A1c and metabolic panel today.  Bumps on shaft of penis.  By history given could be herpes type II but on inspection the areas do not look vesicular.  Day 3 of current symptoms so we will prescribe Famvir antiviral pending HSV 2 antibody.  Considering folliculitis also in differential diagnosis.  If areas do not respond to Famvir then can add on doxycycline oral antibiotic.  Note patient never described ulcer formation.  Also not sexually active and declines other STD Test.  Follow-up 3 weeks or sooner if needed.

## 2022-04-08 LAB — ANA: Anti Nuclear Antibody (ANA): POSITIVE — AB

## 2022-04-08 LAB — RHEUMATOID FACTOR: Rheumatoid fact SerPl-aCnc: <14 IU/mL (ref <14)

## 2022-04-08 LAB — ANTI-NUCLEAR AB-TITER (ANA TITER): ANA Titer 1: 1:40 {titer} — ABNORMAL HIGH

## 2022-04-08 LAB — HSV 2 ANTIBODY, IGG: HSV 2 Glycoprotein G Ab, IgG: 6.84 index — ABNORMAL HIGH

## 2022-05-14 ENCOUNTER — Ambulatory Visit (INDEPENDENT_AMBULATORY_CARE_PROVIDER_SITE_OTHER): Payer: Commercial Managed Care - HMO | Admitting: Medical

## 2022-05-14 ENCOUNTER — Ambulatory Visit (HOSPITAL_BASED_OUTPATIENT_CLINIC_OR_DEPARTMENT_OTHER)
Admission: RE | Admit: 2022-05-14 | Discharge: 2022-05-14 | Disposition: A | Discharge status: Home / Self Care | Payer: Commercial Managed Care - HMO | Source: Ambulatory Visit | Attending: Medical | Admitting: Medical

## 2022-05-14 ENCOUNTER — Encounter: Payer: Self-pay | Admitting: Medical

## 2022-05-14 VITALS — BP 162/98 | HR 72 | Temp 97.6°F | Ht 73.0 in | Wt 294.0 lb

## 2022-05-14 DIAGNOSIS — M79671 Pain in right foot: Secondary | ICD-10-CM

## 2022-05-14 DIAGNOSIS — M79672 Pain in left foot: Secondary | ICD-10-CM | POA: Diagnosis not present

## 2022-05-14 DIAGNOSIS — G629 Polyneuropathy, unspecified: Secondary | ICD-10-CM

## 2022-05-14 LAB — CBC WITH DIFFERENTIAL/PLATELET
Basophils Absolute: 0 10*3/uL (ref 0.0–0.1)
Basophils Relative: 0.4 % (ref 0.0–3.0)
Eosinophils Absolute: 0.3 10*3/uL (ref 0.0–0.7)
Eosinophils Relative: 4.7 % (ref 0.0–5.0)
HCT: 44.3 % (ref 39.0–52.0)
Hemoglobin: 14.8 g/dL (ref 13.0–17.0)
Lymphocytes Relative: 33.9 % (ref 12.0–46.0)
Lymphs Abs: 2.3 10*3/uL (ref 0.7–4.0)
MCHC: 33.4 g/dL (ref 30.0–36.0)
MCV: 87.5 fl (ref 78.0–100.0)
Monocytes Absolute: 0.5 10*3/uL (ref 0.1–1.0)
Monocytes Relative: 7.7 % (ref 3.0–12.0)
Neutro Abs: 3.6 10*3/uL (ref 1.4–7.7)
Neutrophils Relative %: 53.3 % (ref 43.0–77.0)
Platelets: 211 10*3/uL (ref 150.0–400.0)
RBC: 5.06 Mil/uL (ref 4.22–5.81)
RDW: 13.8 % (ref 11.5–15.5)
WBC: 6.7 10*3/uL (ref 4.0–10.5)

## 2022-05-14 LAB — VITAMIN B12: Vitamin B-12: 248 pg/mL (ref 211–911)

## 2022-05-14 LAB — URIC ACID: Uric Acid, Serum: 6.5 mg/dL (ref 4.0–7.8)

## 2022-05-14 MED ORDER — TRAMADOL HCL 50 MG PO TABS: 50.0000 mg | ORAL_TABLET | Freq: Four times a day (QID) | ORAL | 0 refills | Status: AC | PRN

## 2022-05-14 NOTE — Patient Instructions (Addendum)
Bilateral feet pain with high arches and some pain medial aspect great toes. Will get xray of rt foot since pain worse and get uric acid level.  Can use tylenol for pain presently. Rx tramadol as well since high level pain 8/10.  Placed referral to podiatrist  If uric acid increased likely rx colchicine.  For neuropathy get b12 and b1 level. Note tramadol can help with nerve pain.  Htn- bp very high but did not take bp med today. Please take your bp med when you get home. No nsaids recommended with high bp. If you were to check bp later and bp less than 140/90 then could add on low dose ibuprofen 200-400 mg every 8 hours.  Follow up 3-4 weeks or sooner if needed  Also check bp after taking meds and call back later today with updated bp reading.

## 2022-05-14 NOTE — Progress Notes (Signed)
   Subjective:    Patient ID: Melvin Delgado, male    DOB: 1966/04/13, 56 y.o.   MRN: [id number]  HPI 2 months of rt foot pain. Last week worse. Pain in metatarsal head area. Also some pain in left foot as well but a lot less.   On rt foot some pain at medial aspect of great toe. Left side as well at base of toe but less.    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 No trauma or fall.  Pt has htn. No cardio or neurologic signs/symptoms. Pt on 4 med regimen but did not take tabs this am.  Before this foot pain started he describes having nerve type pain in his feet. Pt is diabetic.   Review of Systems See hpi.    Objective:   Physical Exam   General- No acute distress. Pleasant patient. Neck- Full range of motion, no jvd Lungs- Clear, even and unlabored. Heart- regular rate and rhythm. Neurologic- CNII- XII grossly intact.   Both feet- very high arches. Mild-moderate metatarsal head area pain. Rt medial aspect     Assessment & Plan:   Patient Instructions  Bilateral feet pain with high arches and some pain medial aspect great toes. Will get xray of rt foot since pain worse and get uric acid level.  Can use tylenol for pain presently. Rx tramadol as well since high level pain 8/10.  Placed referral to podiatrist  If uric acid increased likely rx colchicine.  For neuropathy get b12 and b1 level. Note tramadol can help with nerve pain.  Htn- bp very high but did not take bp med today. Please take your bp med when you get home. No nsaids recommended with high bp. If you were to check bp later and bp less than 140/90 then could add on low dose ibuprofen 200-400 mg every 8 hours.  Follow up 3-4 weeks or sooner if needed    [employer], PA-C

## 2022-05-16 MED ORDER — COLCHICINE 0.6 MG PO TABS: 0.6000 mg | ORAL_TABLET | Freq: Two times a day (BID) | ORAL | 0 refills | Status: DC

## 2022-05-16 NOTE — Addendum Note (Signed)
Addended by: Gwenevere Abbot on: 05/16/2022 06:50 AM   Modules accepted: Orders

## 2022-05-19 LAB — VITAMIN B1: Vitamin B1 (Thiamine): <6 nmol/L — ABNORMAL LOW (ref 8–30)

## 2022-05-22 ENCOUNTER — Ambulatory Visit (INDEPENDENT_AMBULATORY_CARE_PROVIDER_SITE_OTHER): Payer: Commercial Managed Care - HMO

## 2022-05-22 DIAGNOSIS — E538 Deficiency of other specified B group vitamins: Secondary | ICD-10-CM

## 2022-05-22 MED ORDER — CYANOCOBALAMIN 1000 MCG/ML IJ SOLN
1000.0000 ug | INTRAMUSCULAR | Status: AC
  Administered 2022-05-22 – 2022-06-12 (×2): 1000 ug via INTRAMUSCULAR

## 2022-05-22 NOTE — Progress Notes (Addendum)
Pt here for weekly B12 injection per Saguier  B12 given IM, and pt tolerated injection well.  Next B12 injection scheduled for Wednesday next week.    Esperanza Richters, PA-C

## 2022-05-23 ENCOUNTER — Ambulatory Visit (INDEPENDENT_AMBULATORY_CARE_PROVIDER_SITE_OTHER): Payer: Commercial Managed Care - HMO | Admitting: Podiatry

## 2022-05-23 ENCOUNTER — Ambulatory Visit: Payer: Commercial Managed Care - HMO

## 2022-05-23 DIAGNOSIS — Q6671 Congenital pes cavus, right foot: Secondary | ICD-10-CM | POA: Diagnosis not present

## 2022-05-23 DIAGNOSIS — M10471 Other secondary gout, right ankle and foot: Secondary | ICD-10-CM

## 2022-05-23 DIAGNOSIS — Q6672 Congenital pes cavus, left foot: Secondary | ICD-10-CM | POA: Diagnosis not present

## 2022-05-23 DIAGNOSIS — M7741 Metatarsalgia, right foot: Secondary | ICD-10-CM

## 2022-05-23 DIAGNOSIS — M7742 Metatarsalgia, left foot: Secondary | ICD-10-CM | POA: Diagnosis not present

## 2022-05-23 LAB — HM DIABETES FOOT EXAM: HM Diabetic Foot Exam: NORMAL

## 2022-05-23 MED ORDER — INDOMETHACIN 50 MG PO CAPS
50.0000 mg | ORAL_CAPSULE | Freq: Three times a day (TID) | ORAL | 0 refills | Status: AC
Start: 2022-05-23 — End: 2022-06-06

## 2022-05-23 NOTE — Progress Notes (Signed)
  Subjective:  Patient ID: Melvin Delgado, male    DOB: Mar 02, 1966,  MRN: [id number]  Chief Complaint  Patient presents with   Foot Pain    Right big toe joint, painful for months, acute x 3 weeks. Had xrays this month, tried colchicine and tramadol but no relief. No history of gout    56 y.o. male presents with the above complaint. History confirmed with patient.  His uric acid was checked and was borderline.  He did take the colchicine says it helped a little bit.  Most of the pain is diffuse across the ball of the foot underneath he works as a Insurance underwriter feels like at the end of his work shift its burning and aching  Objective:  Physical Exam: warm, good capillary refill, no trophic changes or ulcerative lesions, normal DP and PT pulses, normal sensory exam, and moderate pes cavus foot deformity with significant pressure under the metatarsal heads with hyperkeratosis and tenderness here.  There is some diffuse edema and swelling around the right forefoot.  No pain in the big toe itself or MPJ   Radiographs: Multiple views x-ray of the right foot: Previous radiographs were reviewed there is no acute fracture or dislocation, sclerosis of the sesamoids, joint spaces maintained Assessment:   1. Metatarsalgia of both feet   2. Congenital bilateral pes cavus   3. Acute gout due to other secondary cause involving toe of right foot      Plan:  Patient was evaluated and treated and all questions answered.  I reviewed his labs radiographs and clinical exam findings with him.  I do think gout may be still in the picture here.  I prescribed him indomethacin which will function well as a general anti-inflammatory for his metatarsalgia which is also an issue.  This is all secondary to his pes cavus foot deformity.  I do think he would benefit greatly from a custom molded longitudinal foot orthosis.  He was casted for these today.  I will see him back in 6 weeks for orthotic  pickup and reevaluation.  Return in 6 weeks (on 07/04/2022), or if symptoms worsen or fail to improve, for to pick up orthotics.

## 2022-05-29 ENCOUNTER — Ambulatory Visit (INDEPENDENT_AMBULATORY_CARE_PROVIDER_SITE_OTHER): Payer: Commercial Managed Care - HMO

## 2022-05-29 DIAGNOSIS — E538 Deficiency of other specified B group vitamins: Secondary | ICD-10-CM

## 2022-05-29 MED ORDER — CYANOCOBALAMIN 1000 MCG/ML IJ SOLN
1000.0000 ug | INTRAMUSCULAR | Status: AC
  Administered 2022-05-29: 1000 ug via INTRAMUSCULAR

## 2022-05-29 NOTE — Progress Notes (Addendum)
Pt here for monthly B12 injection per Saguier  B12 given IM, and pt tolerated injection well.  Pt will get next B12 shot at appt with Saguier on 06/05/22  Esperanza Richters, PA-C

## 2022-06-05 ENCOUNTER — Ambulatory Visit (INDEPENDENT_AMBULATORY_CARE_PROVIDER_SITE_OTHER): Payer: Commercial Managed Care - HMO | Admitting: Medical

## 2022-06-05 ENCOUNTER — Encounter: Payer: Self-pay | Admitting: Medical

## 2022-06-05 VITALS — BP 170/80 | HR 68 | Temp 98.3°F | Resp 18 | Ht 73.0 in | Wt 288.4 lb

## 2022-06-05 DIAGNOSIS — M1000 Idiopathic gout, unspecified site: Secondary | ICD-10-CM

## 2022-06-05 DIAGNOSIS — I10 Essential (primary) hypertension: Secondary | ICD-10-CM

## 2022-06-05 DIAGNOSIS — E538 Deficiency of other specified B group vitamins: Secondary | ICD-10-CM

## 2022-06-05 DIAGNOSIS — G629 Polyneuropathy, unspecified: Secondary | ICD-10-CM | POA: Diagnosis not present

## 2022-06-05 DIAGNOSIS — M79671 Pain in right foot: Secondary | ICD-10-CM | POA: Diagnosis not present

## 2022-06-05 DIAGNOSIS — M79672 Pain in left foot: Secondary | ICD-10-CM | POA: Diagnosis not present

## 2022-06-05 MED ORDER — HYDRALAZINE HCL 100 MG PO TABS: 100.0000 mg | ORAL_TABLET | Freq: Two times a day (BID) | ORAL | 11 refills | Status: DC

## 2022-06-05 MED ORDER — CYANOCOBALAMIN 1000 MCG/ML IJ SOLN
1000.0000 ug | Freq: Once | INTRAMUSCULAR | Status: AC
  Administered 2022-06-05: 1000 ug via INTRAMUSCULAR

## 2022-06-05 MED ORDER — VALSARTAN 320 MG PO TABS: 320.0000 mg | ORAL_TABLET | Freq: Every day | ORAL | 3 refills | Status: AC

## 2022-06-05 MED ORDER — COLCHICINE 0.6 MG PO TABS: 0.6000 mg | ORAL_TABLET | Freq: Two times a day (BID) | ORAL | 0 refills | Status: AC

## 2022-06-05 NOTE — Patient Instructions (Addendum)
Combination of feet pain from b12 deficiency neuropathy, gout and metatarsalgia. Continue b12 injections as discussed, use orthotic and will rx colchicine for gout. Dc indocin/indomethacin.  Htn- bp high today. DC indocin as nsaids can increase bp.  Continue hydralzaine 100 mg bid, toprol xl 50 mg daly, chlorthalidone 25 mg daily and increase valsartan to 320 mg.  Follow up in one week for b12 injection. Want to get nurse bp check same day as well.

## 2022-06-05 NOTE — Progress Notes (Signed)
Subjective:    Patient ID: Melvin Delgado, male    DOB: 1966-09-12, 56 y.o.   MRN: [id number]  HPI  Pt in for follow up.  Pt had lower ext neuropathy type pain. On work up and had low b12. Pt had 3 b12 injections. He states neuropathy pain has decreased about 50-60% less.   Pt also saw podiatrist for metatarsalgia. Pt is getting orthotics.  A/P  Assessment:    1. Metatarsalgia of both feet   2. Congenital bilateral pes cavus   3. Acute gout due to other secondary cause involving toe of right foot         Plan:  Patient was evaluated and treated and all questions answered.   I reviewed his labs radiographs and clinical exam findings with him.  I do think gout may be still in the picture here.  I prescribed him indomethacin which will function well as a general anti-inflammatory for his metatarsalgia which is also an issue.  This is all secondary to his pes cavus foot deformity.  I do think he would benefit greatly from a custom molded longitudinal foot orthosis.  He was casted for these today.  I will see him back in 6 weeks for orthotic pickup and reevaluation.   Htn- pt is on hydralzaine 100 mg bid, toprol xl 50 mg daly, chlorthalidone 25 mg daily and valsartan 160 mg daily.   At times he has some constipation and when straining feel lump that hurts and itches. Had this for about 3 weeks.  Review of Systems  Constitutional:  Negative for chills and fever.  Respiratory:  Negative for cough, chest tightness and wheezing.   Cardiovascular:  Negative for chest pain and palpitations.  Gastrointestinal:  Negative for abdominal pain.       At times he has some constipation and when straining feel lump that hurts and itches. Had this for about 3 weeks.  Musculoskeletal:  Negative for back pain and joint swelling.       Feet pain  Skin:  Negative for rash.  Neurological:  Negative for dizziness, tremors, syncope and light-headedness.  Hematological:  Negative for adenopathy. Does  not bruise/bleed easily.  Psychiatric/Behavioral:  Negative for behavioral problems, decreased concentration and dysphoric mood.        Objective:   Physical Exam  General Mental Status- Alert. General Appearance- Not in acute distress.   Skin General: Color- Normal Color. Moisture- Normal Moisture.  Neck Carotid Arteries- Normal color. Moisture- Normal Moisture. No carotid bruits. No JVD.  Chest and Lung Exam Auscultation: Breath Sounds:-Normal.  Cardiovascular Auscultation:Rythm- Regular. Murmurs & Other Heart Sounds:Auscultation of the heart reveals- No Murmurs.  Abdomen Inspection:-Inspeection Normal. Palpation/Percussion:Note:No mass. Palpation and Percussion of the abdomen reveal- Non Tender, Non Distended + BS, no rebound or guarding.   Neurologic Cranial Nerve exam:- CN III-XII intact(No nystagmus), symmetric smile. Strength:- 5/5 equal and symmetric strength both upper and lower extremities.       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 Continue b12 injections as discussed, use orthotic and will rx colchicine for gout. Dc indocin/indomethacin.  Htn- bp high today. DC indocin as nsaids can increase bp.  Continue hydralzaine 100 mg bid, toprol xl 50 mg daly, chlorthalidone 25 mg daily and increase valsartan to 320 mg.  Follow up in one week for b12 injection. Want to get nurse bp check same day as well.    Esperanza Richters, PA-C

## 2022-06-12 ENCOUNTER — Ambulatory Visit (INDEPENDENT_AMBULATORY_CARE_PROVIDER_SITE_OTHER): Payer: Commercial Managed Care - HMO

## 2022-06-12 ENCOUNTER — Telehealth: Payer: Self-pay | Admitting: Podiatry

## 2022-06-12 DIAGNOSIS — E538 Deficiency of other specified B group vitamins: Secondary | ICD-10-CM

## 2022-06-12 NOTE — Progress Notes (Addendum)
Pt here for monthly B12 injection per Sagu[facility]  B12 given IM, and pt tolerated injection well.  Next B12 injection scheduled for 07/18/22   Pt here for Blood pressure check per Saguier  Pt currently takes:  Hydralzaine 100 mg bid,  Toprol xl 50 mg daly,  Chlorthalidone 25 mg daily   Valsartan 320 mg.   Pt reports he did NOT take any meds this morning. Pt states he woke up late  BP today @ =160/100 HR = 76  Pt advised per S[facility] to go home and take meds and come back next week to recheck BP. NV scheduled for BP and monthly B12  Esperanza Richters, PA-C

## 2022-06-19 ENCOUNTER — Ambulatory Visit (INDEPENDENT_AMBULATORY_CARE_PROVIDER_SITE_OTHER): Payer: Commercial Managed Care - HMO

## 2022-06-19 VITALS — BP 160/90

## 2022-06-19 DIAGNOSIS — I10 Essential (primary) hypertension: Secondary | ICD-10-CM

## 2022-06-19 MED ORDER — HYDRALAZINE HCL 100 MG PO TABS: 100.0000 mg | ORAL_TABLET | Freq: Three times a day (TID) | ORAL | 3 refills | Status: DC

## 2022-06-19 NOTE — Progress Notes (Addendum)
Pt here for Blood pressure check per  BP was 160/100  Pt here for Blood pressure check per [facility]  Pt currently takes:  Hydralzaine 100 mg bid,  Toprol xl 50 mg daly,  Chlorthalidone 25 mg daily  Valsartan 320 mg.  Pt reports compliance with medication.he did take Meds this morning.  BP today @ = Right side 182/110 Left side 178/94 Recheck 160/90 by provider  HR =77  Pt advised per   I checked twice and got 160/90 twice rt side.  Continue bp meds same but increasing hydralazine 100 mg to tid. Make follow up appointment with me in one week.   Esperanza Richters, PA-C

## 2022-06-20 ENCOUNTER — Ambulatory Visit: Payer: Commercial Managed Care - HMO

## 2022-06-26 ENCOUNTER — Other Ambulatory Visit (HOSPITAL_BASED_OUTPATIENT_CLINIC_OR_DEPARTMENT_OTHER): Payer: Self-pay

## 2022-06-26 ENCOUNTER — Ambulatory Visit (INDEPENDENT_AMBULATORY_CARE_PROVIDER_SITE_OTHER): Payer: Commercial Managed Care - HMO | Admitting: Medical

## 2022-06-26 ENCOUNTER — Encounter: Payer: Self-pay | Admitting: Medical

## 2022-06-26 VITALS — BP 160/88 | HR 80 | Temp 98.6°F | Resp 18 | Wt 291.8 lb

## 2022-06-26 DIAGNOSIS — R051 Acute cough: Secondary | ICD-10-CM | POA: Diagnosis not present

## 2022-06-26 LAB — POC COVID19 BINAXNOW: SARS Coronavirus 2 Ag: POSITIVE — AB

## 2022-06-26 MED ORDER — BENZONATATE 100 MG PO CAPS
100.0000 mg | ORAL_CAPSULE | Freq: Three times a day (TID) | ORAL | 0 refills | Status: DC | PRN
  Filled 2022-06-26: qty 30, 10d supply, fill #0

## 2022-06-26 MED ORDER — MOLNUPIRAVIR EUA 200MG CAPSULE
4.0000 | ORAL_CAPSULE | Freq: Two times a day (BID) | ORAL | 0 refills | Status: AC
  Filled 2022-06-26: qty 40, 5d supply, fill #0

## 2022-06-26 MED ORDER — FLUTICASONE PROPIONATE 50 MCG/ACT NA SUSP
2.0000 | Freq: Every day | NASAL | 1 refills | Status: AC
  Filled 2022-06-26: qty 16, 30d supply, fill #0

## 2022-06-26 MED ORDER — CLONIDINE HCL 0.1 MG PO TABS
0.1000 mg | ORAL_TABLET | Freq: Two times a day (BID) | ORAL | 0 refills | Status: AC
  Filled 2022-06-26: qty 60, 30d supply, fill #0

## 2022-06-26 NOTE — Patient Instructions (Addendum)
COVID infection with early mild onset of symptoms.  Vaccinated 3 times in the past and had COVID infection 1 time prior.  Prescribing Flonase for nasal congestion, benzonatate for cough and prescribed molnupiravir antiviral.  Advised patient not to work for the next 5 days.  If feeling better with no fever on day 6 can return to work but use mask for additional 5 days.  If signs and symptoms worsen or change notify us.   Hypertension-blood pressure very high recently and historically difficult to control blood pressure.  Recently increased hydralazine dosing/frequency but BP still high despite numerous medications.  Adding clonidine 0.1 mg twice daily to regimen today.  Asked patient to make nurse blood pressure check in 1 week.  I am not in office next week but did explain to patient that if he needs to be seen we do have other offices and provider in our office to cover for each other provided space available in our schedules.  Follow-up with me in 3 to 4 weeks or sooner if needed.

## 2022-06-26 NOTE — Progress Notes (Addendum)
Subjective:    Patient ID: Melvin Delgado, male    DOB: September 03, 1966, 56 y.o.   MRN: [id number]  HPI Pt has mild st and mild runny nose since Monday. Mild sweating. No other symptoms.   Hx of covid vaccine 3 times in past. No sob or wheezing.    Htn- bp high. No cardiac or neurologic signs and symptoms. Pt bp has been resistant in past. Pt on 4 med classes high dose. Most recently increased hydralazine.    Review of Systems  Constitutional:  Negative for chills, fatigue and fever.  HENT:  Positive for congestion and sore throat.   Respiratory:  Negative for cough, chest tightness, shortness of breath and wheezing.   Cardiovascular:  Negative for chest pain and palpitations.  Gastrointestinal:  Negative for anal bleeding.  Genitourinary:  Negative for difficulty urinating and enuresis.  Musculoskeletal:  Negative for back pain.  Skin:  Negative for rash.  Neurological:  Negative for dizziness, seizures and headaches.  Hematological:  Negative for adenopathy. Does not bruise/bleed easily.  Psychiatric/Behavioral:  Negative for agitation and dysphoric mood. The patient is not nervous/anxious.     Past Medical History:  Diagnosis Date   Diabetes mellitus without complication (HCC)    High cholesterol    Hypertension      Social History   Socioeconomic History   Marital status: Single    Spouse name: Not on file   Number of children: Not on file   Years of education: Not on file   Highest education level: Not on file  Occupational History   Not on file  Tobacco Use   Smoking status: Every Day    Packs/day: 0.50    Types: Cigarettes    Start date: 06/15/1985   Smokeless tobacco: Never  Vaping Use   Vaping Use: Never used  Substance and Sexual Activity   Alcohol use: Yes    Comment: 1 every 2-3 weeks   Drug use: Never   Sexual activity: Not on file  Other Topics Concern   Not on file  Social History Narrative   Not on file   Social Determinants of Health    Financial Resource Strain: Not on file  Food Insecurity: Not on file  Transportation Needs: Not on file  Physical Activity: Not on file  Stress: Not on file  Social Connections: Not on file  Intimate Partner Violence: Not on file    Past Surgical History:  Procedure Laterality Date   SHOULDER ARTHROSCOPY Left     Family History  Problem Relation Age of Onset   Osteoporosis Mother     No Known Allergies  Current Outpatient Medications on File Prior to Visit  Medication Sig Dispense Refill   atorvastatin (LIPITOR) 10 MG tablet Take 1 tablet (10 mg total) by mouth daily. 90 tablet 3   chlorthalidone (HYGROTON) 25 MG tablet Take 1 tablet (25 mg total) by mouth daily. 90 tablet 3   colchicine 0.6 MG tablet Take 1 tablet (0.6 mg total) by mouth 2 (two) times daily. 60 tablet 0   cyclobenzaprine (FLEXERIL) 10 MG tablet Take 1 tablet (10 mg total) by mouth 3 (three) times daily as needed for muscle spasms. 10 tablet 0   glipiZIDE (GLUCOTROL XL) 5 MG 24 hr tablet Take 1 tablet (5 mg total) by mouth daily with breakfast. 30 tablet 3   hydrALAZINE (APRESOLINE) 100 MG tablet Take 1 tablet (100 mg total) by mouth 3 (three) times daily. 90 tablet 3   metFORMIN (  GLUCOPHAGE) 850 MG tablet Take 850 mg by mouth 3 (three) times daily.     metoprolol succinate (TOPROL-XL) 50 MG 24 hr tablet Take 1 tablet (50 mg total) by mouth daily. 30 tablet 3   nystatin cream (MYCOSTATIN) Apply 1 application topically 2 (two) times daily. (Patient not taking: Reported on 05/14/2022) 30 g 0   saxagliptin HCl (ONGLYZA) 5 MG TABS tablet Take 1 tablet (5 mg total) by mouth daily. 30 tablet 11   valsartan (DIOVAN) 320 MG tablet Take 1 tablet (320 mg total) by mouth daily. 90 tablet 3   Current Facility-Administered Medications on File Prior to Visit  Medication Dose Route Frequency Provider Last Rate Last Admin   cyanocobalamin (VITAMIN B12) injection 1,000 mcg  1,000 mcg Intramuscular Q30 days Mackie Pai, PA-C    1,000 mcg at 06/12/22 8264   cyanocobalamin (VITAMIN B12) injection 1,000 mcg  1,000 mcg Intramuscular Q30 days Mackie Pai, PA-C   1,000 mcg at 05/29/22 0925    BP (!) 160/88   Pulse 80   Temp 98.6 F (37 C)   Resp 18   Wt 291 lb 12.8 oz (132.4 kg)   SpO2 97%   BMI 38.50 kg/m    I checked pt 02 since was 90% originally. Kept on pt finger and maintained. Never dropped. ESPAC         Objective:   Physical Exam  General Mental Status- Alert. General Appearance- Not in acute distress.   Skin General: Color- Normal Color. Moisture- Normal Moisture.  Neck Carotid Arteries- Normal color. Moisture- Normal Moisture. No carotid bruits. No JVD.  Chest and Lung Exam Auscultation: Breath Sounds:-Normal. Clear even and unlabored.  Cardiovascular Auscultation:Rythm- Regular. Murmurs & Other Heart Sounds:Auscultation of the heart reveals- No Murmurs.  Abdomen Inspection:-Inspeection Normal. Palpation/Percussion:Note:No mass. Palpation and Percussion of the abdomen reveal- Non Tender, Non Distended + BS, no rebound or guarding.    Neurologic Cranial Nerve exam:- CN III-XII intact(No nystagmus), symmetric smile. Strength:- 5/5 equal and symmetric strength both upper and lower extremities.   Heent- no sinus pressure. Mild nasal congestion.    Assessment & Plan:   Patient Instructions  COVID infection with early mild onset of symptoms.  Vaccinated 3 times in the past and had COVID infection 1 time prior.  Prescribing Flonase for nasal congestion, benzonatate for cough and prescribed molnupiravir antiviral.  Advised patient not to work for the next 5 days.  If feeling better with no fever on day 6 can return to work but use mask for additional 5 days.  If signs and symptoms worsen or change notify us.   Hypertension-blood pressure very high recently and historically difficult to control blood pressure.  Recently increased hydralazine dosing/frequency but BP still high  despite numerous medications.  Adding clonidine 0.1 mg twice daily to regimen today.  Asked patient to make nurse blood pressure check in 1 week.  Follow-up with me in 3 to 4 weeks or sooner if needed.

## 2022-07-02 ENCOUNTER — Ambulatory Visit: Payer: Commercial Managed Care - HMO

## 2022-07-18 ENCOUNTER — Ambulatory Visit (INDEPENDENT_AMBULATORY_CARE_PROVIDER_SITE_OTHER): Payer: Commercial Managed Care - HMO

## 2022-07-18 DIAGNOSIS — E538 Deficiency of other specified B group vitamins: Secondary | ICD-10-CM

## 2022-07-18 MED ORDER — CYANOCOBALAMIN 1000 MCG/ML IJ SOLN
1000.0000 ug | Freq: Once | INTRAMUSCULAR | Status: AC
  Administered 2022-07-18: 1000 ug via INTRAMUSCULAR

## 2022-07-18 NOTE — Progress Notes (Addendum)
Melvin Delgado is a 56 y.o. male presents to the office today for B12:  injections, per physician's orders. Original order: per note on lab results fron 05/16/22 :  Recommend getting set up for weekly b12 vitamin injection over next 4 weeks followed by monthly injection for 5 months. Then repeat b12 level in 6 months  Cyanocobalamin (med), 1000 mg/ml (dose),  IM (route) was administered Left deltoid (location) today. Patient tolerated injection. Patient due for follow up labs/provider appt: ction due: in one month, appt made Yes FOR 08/20/22.   Jolene Provost Rennis Petty, PA-C

## 2022-08-19 NOTE — Progress Notes (Deleted)
Asbury Hair is a 56 y.o. male presents to the office today for 2/6 monthly B12 injections, per physician's orders. Original order: 05/14/22 "Your b12 level is low. This might  be a reason for your neuropathy. Recommend getting set up for weekly b12 vitamin injection over next 4 weeks followed by monthly injection for 5 months. Then repeat b12 level in 6 months. " Cyanocobalamin (med), 1000 mg/ml (dose),  IM (route) was administered *** (location) today.  Patient tolerated injection. Patient due for follow up labs/provider appt: No.  Patient next injection due: 1 month , appt made {yes/no:20286}  Creft, Melton Alar L

## 2022-08-20 ENCOUNTER — Ambulatory Visit: Payer: Commercial Managed Care - HMO

## 2022-08-20 DIAGNOSIS — E538 Deficiency of other specified B group vitamins: Secondary | ICD-10-CM

## 2022-08-21 ENCOUNTER — Ambulatory Visit (INDEPENDENT_AMBULATORY_CARE_PROVIDER_SITE_OTHER): Payer: Commercial Managed Care - HMO | Admitting: Medical

## 2022-08-21 ENCOUNTER — Encounter: Payer: Self-pay | Admitting: Medical

## 2022-08-21 ENCOUNTER — Ambulatory Visit (HOSPITAL_BASED_OUTPATIENT_CLINIC_OR_DEPARTMENT_OTHER)
Admission: RE | Admit: 2022-08-21 | Discharge: 2022-08-21 | Disposition: A | Discharge status: Home / Self Care | Payer: Commercial Managed Care - HMO | Source: Ambulatory Visit | Attending: Medical | Admitting: Medical

## 2022-08-21 VITALS — BP 170/90 | HR 75 | Temp 98.0°F | Resp 18 | Ht 73.0 in | Wt 294.0 lb

## 2022-08-21 DIAGNOSIS — S32000A Wedge compression fracture of unspecified lumbar vertebra, initial encounter for closed fracture: Secondary | ICD-10-CM | POA: Insufficient documentation

## 2022-08-21 DIAGNOSIS — E538 Deficiency of other specified B group vitamins: Secondary | ICD-10-CM

## 2022-08-21 DIAGNOSIS — M545 Low back pain, unspecified: Secondary | ICD-10-CM | POA: Diagnosis not present

## 2022-08-21 DIAGNOSIS — E119 Type 2 diabetes mellitus without complications: Secondary | ICD-10-CM | POA: Diagnosis not present

## 2022-08-21 DIAGNOSIS — I10 Essential (primary) hypertension: Secondary | ICD-10-CM | POA: Diagnosis not present

## 2022-08-21 DIAGNOSIS — M4800 Spinal stenosis, site unspecified: Secondary | ICD-10-CM

## 2022-08-21 MED ORDER — METHYLPREDNISOLONE 4 MG PO TABS: ORAL_TABLET | ORAL | 0 refills | Status: AC

## 2022-08-21 MED ORDER — CYCLOBENZAPRINE HCL 5 MG PO TABS: 5.0000 mg | ORAL_TABLET | Freq: Every day | ORAL | 0 refills | Status: AC

## 2022-08-21 MED ORDER — CYANOCOBALAMIN 1000 MCG/ML IJ SOLN
1000.0000 ug | Freq: Once | INTRAMUSCULAR | Status: AC
  Administered 2022-08-21: 1000 ug via INTRAMUSCULAR

## 2022-08-21 MED ORDER — TRAMADOL HCL 50 MG PO TABS: 50.0000 mg | ORAL_TABLET | Freq: Four times a day (QID) | ORAL | 0 refills | Status: AC | PRN

## 2022-08-21 NOTE — Progress Notes (Signed)
Subjective:    Patient ID: Melvin Delgado, male    DOB: 04-Dec-1965, 56 y.o.   MRN: [id number]  HPI   Pt in for first time.  Pt in for low back for 2 weeks. No accident. Patient states pain can be severe. Hurts to bend over and to walk. If seated and not moving minimal pain.   Pt is taking advil otc 4 tab twice daily. Helps just little bit.  2 years ago had pain. Went chiropracter in past. Pain was limited to 4 days. No radiating pain to legs. No weakness reported.   Pt bp is very elevated. No cardiac or neurologic signs/symptoms reported. Pt on metropolol, chlorthalidone, valsartan and hydralazine. But today he did not take as he left in a hurry.  Diabetes- yesterday sugar was 152 after he ate.  Review of Systems  Constitutional:  Negative for chills, fatigue and fever.  Respiratory:  Negative for cough, chest tightness and wheezing.   Cardiovascular:  Negative for chest pain and palpitations.  Gastrointestinal:  Negative for abdominal pain.  Musculoskeletal:  Positive for back pain.  Skin:  Negative for rash.  Neurological:  Negative for dizziness, speech difficulty, weakness, numbness and headaches.  Hematological:  Negative for adenopathy. Does not bruise/bleed easily.  Psychiatric/Behavioral:  Negative for behavioral problems and confusion.     Past Medical History:  Diagnosis Date   Diabetes mellitus without complication (HCC)    High cholesterol    Hypertension      Social History   Socioeconomic History   Marital status: Single    Spouse name: Not on file   Number of children: Not on file   Years of education: Not on file   Highest education level: Not on file  Occupational History   Not on file  Tobacco Use   Smoking status: Every Day    Packs/day: 0.50    Types: Cigarettes    Start date: 06/15/1985   Smokeless tobacco: Never  Vaping Use   Vaping Use: Never used  Substance and Sexual Activity   Alcohol use: Yes    Comment: 1 every 2-3 weeks   Drug  use: Never   Sexual activity: Not on file  Other Topics Concern   Not on file  Social History Narrative   Not on file   Social Determinants of Health   Financial Resource Strain: Not on file  Food Insecurity: Not on file  Transportation Needs: Not on file  Physical Activity: Not on file  Stress: Not on file  Social Connections: Not on file  Intimate Partner Violence: Not on file    Past Surgical History:  Procedure Laterality Date   SHOULDER ARTHROSCOPY Left     Family History  Problem Relation Age of Onset   Osteoporosis Mother     No Known Allergies  Current Outpatient Medications on File Prior to Visit  Medication Sig Dispense Refill   atorvastatin (LIPITOR) 10 MG tablet Take 1 tablet (10 mg total) by mouth daily. 90 tablet 3   chlorthalidone (HYGROTON) 25 MG tablet Take 1 tablet (25 mg total) by mouth daily. 90 tablet 3   cloNIDine (CATAPRES) 0.1 MG tablet Take 1 tablet (0.1 mg total) by mouth 2 (two) times daily. 60 tablet 0   colchicine 0.6 MG tablet Take 1 tablet (0.6 mg total) by mouth 2 (two) times daily. 60 tablet 0   fluticasone (FLONASE) 50 MCG/ACT nasal spray Place 2 sprays into both nostrils daily. 16 g 1   glipiZIDE (GLUCOTROL  XL) 5 MG 24 hr tablet Take 1 tablet (5 mg total) by mouth daily with breakfast. 30 tablet 3   hydrALAZINE (APRESOLINE) 100 MG tablet Take 1 tablet (100 mg total) by mouth 3 (three) times daily. 90 tablet 3   metFORMIN (GLUCOPHAGE) 850 MG tablet Take 850 mg by mouth 3 (three) times daily.     metoprolol succinate (TOPROL-XL) 50 MG 24 hr tablet Take 1 tablet (50 mg total) by mouth daily. 30 tablet 3   nystatin cream (MYCOSTATIN) Apply 1 application topically 2 (two) times daily. 30 g 0   saxagliptin HCl (ONGLYZA) 5 MG TABS tablet Take 1 tablet (5 mg total) by mouth daily. 30 tablet 11   valsartan (DIOVAN) 320 MG tablet Take 1 tablet (320 mg total) by mouth daily. 90 tablet 3   Current Facility-Administered Medications on File Prior to  Visit  Medication Dose Route Frequency Provider Last Rate Last Admin   cyanocobalamin (VITAMIN B12) injection 1,000 mcg  1,000 mcg Intramuscular Q30 days Esperanza Richters, PA-C   1,000 mcg at 06/12/22 0539   cyanocobalamin (VITAMIN B12) injection 1,000 mcg  1,000 mcg Intramuscular Q30 days Esperanza Richters, PA-C   1,000 mcg at 05/29/22 0925    BP (!) 170/90   Pulse 75   Temp 98 F (36.7 C)   Resp 18   Ht 6\' 1"  (1.854 m)   Wt 294 lb (133.4 kg)   SpO2 99%   BMI 38.79 kg/m          Objective:   Physical Exam  General Appearance- Not in acute distress.    Chest and Lung Exam Auscultation: Breath sounds:-Normal. Clear even and unlabored. Adventitious sounds:- No Adventitious sounds.  Cardiovascular Auscultation:Rythm - Regular, rate and rythm. Heart Sounds -Normal heart sounds.  Abdomen Inspection:-Inspection Normal.  Palpation/Perucssion: Palpation and Percussion of the abdomen reveal- Non Tender, No Rebound tenderness, No rigidity(Guarding) and No Palpable abdominal masses.  Liver:-Normal.  Spleen:- Normal.   Back Mid lumbar spine tenderness to palpation. Pain on lateral movements and flexion/extension of the spine.   Lower ext neurologic L5-S1 sensation intact bilaterally. Normal patellar reflexes bilaterally. No foot drop bilaterally.  Rt great toe mild dull sensation     Assessment & Plan:   Patient Instructions  Low back pain which appears severe with minimal movement/changing position.  Some slight numbness to right great toe on exam.  We will get lumbar spine x-ray today.  Stop any NSAIDs since her blood pressure is elevated.  Note NSAIDs can increase blood pressure.  Prescribing a very low dose 3-day taper Medrol, advised Tylenol 500 mg every 6-8 hours, tramadol 50 mg every 6 hours as needed severe pain and Flexeril 5 mg to use 1 tablet nightly over the next 4 nights.  Rx advisement given.   Hypertension-blood pressure very high today.  Normal neurologic  exam.  High level was probably combination of using Advil last night and not taking blood pressure medicines this morning.  Please resume your BP medication when you get home.  If any motor, sensory deficits or chest pain then be seen in the emergency department.  Please check your blood pressure later today and update me on reading after you take BP medication.  Diabetes-continue current med regimen for diabetes.  Check blood sugar daily notify me if sugars increase over 200 while taking low-dose Medrol.  Be careful with your diet and particularly over the next 3 days.  Offered/recommended work excuse/return to work on Saturday.  Making letter available  to use if needed.  Follow-up 7 to 10 days or sooner if needed.   Esperanza Richters, PA-C

## 2022-08-21 NOTE — Patient Instructions (Addendum)
Low back pain which appears severe with minimal movement/changing position.  Some slight numbness to right great toe on exam.  We will get lumbar spine x-ray today.  Stop any NSAIDs since her blood pressure is elevated.  Note NSAIDs can increase blood pressure.  Prescribing a very low dose 3-day taper Medrol, advised Tylenol 500 mg every 6-8 hours, tramadol 50 mg every 6 hours as needed severe pain and Flexeril 5 mg to use 1 tablet nightly over the next 4 nights.  Rx advisement given.   Hypertension-blood pressure very high today.  Normal neurologic exam.  High level was probably combination of using Advil last night and not taking blood pressure medicines this morning.  Please resume your BP medication when you get home.  If any motor, sensory deficits or chest pain then be seen in the emergency department.  Please check your blood pressure later today and update me on reading after you take BP medication.  Diabetes-continue current med regimen for diabetes.  Check blood sugar daily notify me if sugars increase over 200 while taking low-dose Medrol.  Be careful with your diet and particularly over the next 3 days.  Offered/recommended work excuse/return to work on Saturday.  Making letter available to use if needed.  Follow-up 7 to 10 days or sooner if needed.

## 2022-08-21 NOTE — Addendum Note (Signed)
Addended by: Gwenevere Abbot on: 08/21/2022 12:58 PM   Modules accepted: Orders

## 2022-08-21 NOTE — Addendum Note (Signed)
Addended by: Maximino Sarin on: 08/21/2022 09:06 AM   Modules accepted: Orders

## 2022-08-23 ENCOUNTER — Telehealth: Payer: Self-pay | Admitting: Medical

## 2022-08-23 NOTE — Telephone Encounter (Signed)
Patient states his back is not feeling better so he would like to be referred to a specialist. Please advise.

## 2022-08-26 NOTE — Telephone Encounter (Signed)
Spoke to patient in spanish and he was given all the information along with phone number to call them.

## 2022-08-26 NOTE — Addendum Note (Signed)
Addended by: Gwenevere Abbot on: 08/26/2022 10:00 AM   Modules accepted: Orders

## 2022-09-12 ENCOUNTER — Telehealth: Payer: Self-pay | Admitting: Podiatry

## 2022-09-12 NOTE — Telephone Encounter (Signed)
Left message on voicemail for pt to call back to schedule appt to pick up orthotics  Balance is 183.76

## 2022-09-18 ENCOUNTER — Ambulatory Visit (INDEPENDENT_AMBULATORY_CARE_PROVIDER_SITE_OTHER): Payer: Commercial Managed Care - HMO | Admitting: Medical

## 2022-09-18 ENCOUNTER — Encounter: Payer: Self-pay | Admitting: Medical

## 2022-09-18 VITALS — BP 140/80 | HR 88 | Temp 98.0°F | Resp 18 | Ht 73.0 in | Wt 288.0 lb

## 2022-09-18 DIAGNOSIS — E119 Type 2 diabetes mellitus without complications: Secondary | ICD-10-CM | POA: Diagnosis not present

## 2022-09-18 DIAGNOSIS — I10 Essential (primary) hypertension: Secondary | ICD-10-CM

## 2022-09-18 DIAGNOSIS — R195 Other fecal abnormalities: Secondary | ICD-10-CM

## 2022-09-18 DIAGNOSIS — R1013 Epigastric pain: Secondary | ICD-10-CM | POA: Diagnosis not present

## 2022-09-18 DIAGNOSIS — L089 Local infection of the skin and subcutaneous tissue, unspecified: Secondary | ICD-10-CM

## 2022-09-18 DIAGNOSIS — E538 Deficiency of other specified B group vitamins: Secondary | ICD-10-CM | POA: Diagnosis not present

## 2022-09-18 LAB — COMPREHENSIVE METABOLIC PANEL
ALT: 16 U/L (ref 0–53)
AST: 12 U/L (ref 0–37)
Albumin: 3.9 g/dL (ref 3.5–5.2)
Alkaline Phosphatase: 95 U/L (ref 39–117)
BUN: 13 mg/dL (ref 6–23)
CO2: 29 mEq/L (ref 19–32)
Calcium: 9.3 mg/dL (ref 8.4–10.5)
Chloride: 99 mEq/L (ref 96–112)
Creatinine, Ser: 0.73 mg/dL (ref 0.40–1.50)
GFR: 101.1 mL/min (ref >60.00)
Glucose, Bld: 189 mg/dL — ABNORMAL HIGH (ref 70–99)
Potassium: 4.2 mEq/L (ref 3.5–5.1)
Sodium: 136 mEq/L (ref 135–145)
Total Bilirubin: 0.5 mg/dL (ref 0.2–1.2)
Total Protein: 6.7 g/dL (ref 6.0–8.3)

## 2022-09-18 LAB — CBC WITH DIFFERENTIAL/PLATELET
Basophils Absolute: 0 10*3/uL (ref 0.0–0.1)
Basophils Relative: 0.5 % (ref 0.0–3.0)
Eosinophils Absolute: 0.3 10*3/uL (ref 0.0–0.7)
Eosinophils Relative: 4.2 % (ref 0.0–5.0)
HCT: 45.9 % (ref 39.0–52.0)
Hemoglobin: 15.4 g/dL (ref 13.0–17.0)
Lymphocytes Relative: 33.7 % (ref 12.0–46.0)
Lymphs Abs: 2.2 10*3/uL (ref 0.7–4.0)
MCHC: 33.6 g/dL (ref 30.0–36.0)
MCV: 87.1 fl (ref 78.0–100.0)
Monocytes Absolute: 0.5 10*3/uL (ref 0.1–1.0)
Monocytes Relative: 7.2 % (ref 3.0–12.0)
Neutro Abs: 3.6 10*3/uL (ref 1.4–7.7)
Neutrophils Relative %: 54.4 % (ref 43.0–77.0)
Platelets: 221 10*3/uL (ref 150.0–400.0)
RBC: 5.27 Mil/uL (ref 4.22–5.81)
RDW: 14 % (ref 11.5–15.5)
WBC: 6.6 10*3/uL (ref 4.0–10.5)

## 2022-09-18 LAB — VITAMIN B12: Vitamin B-12: >1500 pg/mL — ABNORMAL HIGH (ref 211–911)

## 2022-09-18 LAB — LIPASE: Lipase: 42 U/L (ref 11.0–59.0)

## 2022-09-18 MED ORDER — CYANOCOBALAMIN 1000 MCG/ML IJ SOLN
1000.0000 ug | Freq: Once | INTRAMUSCULAR | Status: AC
  Administered 2022-09-18: 1000 ug via INTRAMUSCULAR

## 2022-09-18 MED ORDER — FAMOTIDINE 20 MG PO TABS: 20.0000 mg | ORAL_TABLET | Freq: Every day | ORAL | 0 refills | Status: AC

## 2022-09-18 MED ORDER — CEPHALEXIN 500 MG PO CAPS: 500.0000 mg | ORAL_CAPSULE | Freq: Two times a day (BID) | ORAL | 0 refills | Status: AC

## 2022-09-18 MED ORDER — METFORMIN HCL 850 MG PO TABS: 850.0000 mg | ORAL_TABLET | Freq: Two times a day (BID) | ORAL | 3 refills | Status: AC

## 2022-09-18 MED ORDER — HYDRALAZINE HCL 100 MG PO TABS: 100.0000 mg | ORAL_TABLET | Freq: Three times a day (TID) | ORAL | 3 refills | Status: AC

## 2022-09-18 NOTE — Addendum Note (Signed)
Addended by: Maximino Sarin on: 09/18/2022 10:41 AM   Modules accepted: Orders

## 2022-09-18 NOTE — Progress Notes (Signed)
Subjective:    Patient ID: Melvin Delgado, male    DOB: 03/11/1966, 56 y.o.   MRN: [id number]  HPI  Pt in with some rt great toe pain around base of nail for 2 weeks.   Also pt reports numbness to both feet. No motor deficits. Rt side feels more numb. Pt is diabetic and has low b12. He has been on b12 injection for 4 months.  Pt has mild abdomen pain since Monday after eating chinese. On Monday had 4 loose stools. Yesterday had 2 loose stools. Today no loose stool so far. Pain in abdomen comes and goes. Not associated with eating. On Monday mild nausea but none since. Pt has been using immodium. No reflux symptoms. Some burping rare.  Htn- pt on chlorthalidone 25 mg daily, clonidine 0.1 twice daily, hydralazine 100 mg tid and valsartan 320 mg daily. Bp good now but last night states was high at work 188/120. But his machine battery was low. Also he ran out hydralazaine 2 weeks ago. Review of Systems  Constitutional:  Negative for chills, fatigue and fever.  Respiratory:  Negative for cough, chest tightness and wheezing.   Cardiovascular:  Negative for chest pain and palpitations.  Gastrointestinal:  Positive for abdominal pain and diarrhea.  Musculoskeletal:        See hpi.  Neurological:  Negative for dizziness, syncope, weakness, numbness and headaches.  Hematological:  Negative for adenopathy. Does not bruise/bleed easily.  Psychiatric/Behavioral:  Negative for behavioral problems and decreased concentration.     Past Medical History:  Diagnosis Date   Diabetes mellitus without complication (HCC)    High cholesterol    Hypertension      Social History   Socioeconomic History   Marital status: Single    Spouse name: Not on file   Number of children: Not on file   Years of education: Not on file   Highest education level: Not on file  Occupational History   Not on file  Tobacco Use   Smoking status: Every Day    Packs/day: 0.50    Types: Cigarettes    Start date:  06/15/1985   Smokeless tobacco: Never  Vaping Use   Vaping Use: Never used  Substance and Sexual Activity   Alcohol use: Yes    Comment: 1 every 2-3 weeks   Drug use: Never   Sexual activity: Not on file  Other Topics Concern   Not on file  Social History Narrative   Not on file   Social Determinants of Health   Financial Resource Strain: Not on file  Food Insecurity: Not on file  Transportation Needs: Not on file  Physical Activity: Not on file  Stress: Not on file  Social Connections: Not on file  Intimate Partner Violence: Not on file    Past Surgical History:  Procedure Laterality Date   SHOULDER ARTHROSCOPY Left     Family History  Problem Relation Age of Onset   Osteoporosis Mother     No Known Allergies  Current Outpatient Medications on File Prior to Visit  Medication Sig Dispense Refill   atorvastatin (LIPITOR) 10 MG tablet Take 1 tablet (10 mg total) by mouth daily. 90 tablet 3   chlorthalidone (HYGROTON) 25 MG tablet Take 1 tablet (25 mg total) by mouth daily. 90 tablet 3   cloNIDine (CATAPRES) 0.1 MG tablet Take 1 tablet (0.1 mg total) by mouth 2 (two) times daily. 60 tablet 0   colchicine 0.6 MG tablet Take 1 tablet (0.6  mg total) by mouth 2 (two) times daily. 60 tablet 0   cyclobenzaprine (FLEXERIL) 5 MG tablet Take 1 tablet (5 mg total) by mouth at bedtime. 4 tablet 0   fluticasone (FLONASE) 50 MCG/ACT nasal spray Place 2 sprays into both nostrils daily. 16 g 1   glipiZIDE (GLUCOTROL XL) 5 MG 24 hr tablet Take 1 tablet (5 mg total) by mouth daily with breakfast. 30 tablet 3   methylPREDNISolone (MEDROL) 4 MG tablet 3 tab po day 1, 2 tab po day 2 and 1 tab po day 3 6 tablet 0   metoprolol succinate (TOPROL-XL) 50 MG 24 hr tablet Take 1 tablet (50 mg total) by mouth daily. 30 tablet 3   nystatin cream (MYCOSTATIN) Apply 1 application topically 2 (two) times daily. 30 g 0   saxagliptin HCl (ONGLYZA) 5 MG TABS tablet Take 1 tablet (5 mg total) by mouth daily.  30 tablet 11   valsartan (DIOVAN) 320 MG tablet Take 1 tablet (320 mg total) by mouth daily. 90 tablet 3   Current Facility-Administered Medications on File Prior to Visit  Medication Dose Route Frequency Provider Last Rate Last Admin   cyanocobalamin (VITAMIN B12) injection 1,000 mcg  1,000 mcg Intramuscular Q30 days Mackie Pai, PA-C   1,000 mcg at 06/12/22 2458   cyanocobalamin (VITAMIN B12) injection 1,000 mcg  1,000 mcg Intramuscular Q30 days Mackie Pai, PA-C   1,000 mcg at 05/29/22 0925    BP (!) 140/80   Pulse 88   Temp 98 F (36.7 C)   Resp 18   Ht _0  (1.854 m)   Wt 288 lb (130.6 kg)   SpO2 95%   BMI 38.00 kg/m        Objective:   Physical Exam  General Mental Status- Alert. General Appearance- Not in acute distress.   Skin General: Color- Normal Color. Moisture- Normal Moisture.  Neck Carotid Arteries- Normal color. Moisture- Normal Moisture. No carotid bruits. No JVD.  Chest and Lung Exam Auscultation: Breath Sounds:-Normal.  Cardiovascular Auscultation:Rythm- Regular. Murmurs & Other Heart Sounds:Auscultation of the heart reveals- No Murmurs.  Abdomen Inspection:-Inspeection Normal. Palpation/Percussion:Note:No mass. Palpation and Percussion of the abdomen reveal- Non Tender, Non Distended + BS, no rebound or guarding.   Neurologic Cranial Nerve exam:- CN III-XII intact(No nystagmus), symmetric smile. Strength:- 5/5 equal and symmetric strength both upper and lower extremities.   Rt foot- normal except rt great toe mid redness at base of toenail. Faint tender.      Assessment & Plan:   Patient Instructions  1. Essential hypertension Blood pressure better controlled when I checked twice.  Refilled your hydralazine which you are for the past 2 weeks.  Continue all blood pressure medications.  Please let me know if having troubles getting refills.  Your blood pressure last night might have been inaccurate as your batteries were low.   Please recheck your blood pressure at home and let me know if blood pressure readings above 150/90.  Also monitor to skip clonidine as blood pressure levels can rebound when that is skipped.  2. Diabetes mellitus without complication ([place]) Refilled your metformin today.  Will get A1c as well.  3. Skin infection Right great toe minimal skin infection at the base.  Will prescribe Keflex antibiotic.  If area changes or worsens let me know.  4. Loose stools  Recommend presently stopping Imodium.  Symptoms seem to correlate with eating [place] food.  If you have recurrent diarrhea then recommend turning in stool study by  Friday.  - Stool Culture; Future - Clostridium Difficile by PCR; Future - Ova and parasite examination; Future  5. Epigastric pain Could be associated with viral gastroenteritis.  Minimal low-level symptoms.  But will get below labs and notify me if pain worsens.  Can try famotidine/Pepcid over-the-counter. - CBC w/Diff - Comp Met (CMET) - Lipase  6. B12 deficiency B12 injection given today.  Will get B12 level and determine if you need further injections or if can switch to oral supplementation. - B12   7.  Lower extremity numbness likely due to 12 deficiency.  No motor deficits.  Follow-up in 2 weeks or sooner if needed.   Mackie Pai, PA-C

## 2022-09-18 NOTE — Patient Instructions (Addendum)
1. Essential hypertension Blood pressure better controlled when I checked twice.  Refilled your hydralazine which you are for the past 2 weeks.  Continue all blood pressure medications.  Please let me know if having troubles getting refills.  Your blood pressure last night might have been inaccurate as your batteries were low.  Please recheck your blood pressure at home and let me know if blood pressure readings above 150/90.  Also monitor to skip clonidine as blood pressure levels can rebound when that is skipped.  2. Diabetes mellitus without complication ([place]) Refilled your metformin today.  Will get A1c as well.  3. Skin infection Right great toe minimal skin infection at the base.  Will prescribe Keflex antibiotic.  If area changes or worsens let me know.  4. Loose stools  Recommend presently stopping Imodium.  Symptoms seem to correlate with eating [place] food.  If you have recurrent diarrhea then recommend turning in stool study by Friday.  - Stool Culture; Future - Clostridium Difficile by PCR; Future - Ova and parasite examination; Future  5. Epigastric pain Could be associated with viral gastroenteritis.  Minimal low-level symptoms.  But will get below labs and notify me if pain worsens.  Can try famotidine/Pepcid over-the-counter. - CBC w/Diff - Comp Met (CMET) - Lipase  6. B12 deficiency B12 injection given today.  Will get B12 level and determine if you need further injections or if can switch to oral supplementation. - B12   7.  Lower extremity numbness likely due to 12 deficiency.  No motor deficits.  Follow-up in 2 weeks or sooner if needed.

## 2022-09-23 ENCOUNTER — Ambulatory Visit (INDEPENDENT_AMBULATORY_CARE_PROVIDER_SITE_OTHER): Payer: Commercial Managed Care - HMO | Admitting: Medical

## 2022-09-23 ENCOUNTER — Encounter: Payer: Self-pay | Admitting: Medical

## 2022-09-23 ENCOUNTER — Ambulatory Visit: Payer: Commercial Managed Care - HMO | Admitting: Internal Medicine

## 2022-09-23 VITALS — BP 155/88 | HR 95 | Temp 99.9°F | Resp 18 | Ht 73.0 in | Wt 288.0 lb

## 2022-09-23 DIAGNOSIS — R051 Acute cough: Secondary | ICD-10-CM

## 2022-09-23 DIAGNOSIS — J111 Influenza due to unidentified influenza virus with other respiratory manifestations: Secondary | ICD-10-CM

## 2022-09-23 DIAGNOSIS — I10 Essential (primary) hypertension: Secondary | ICD-10-CM | POA: Diagnosis not present

## 2022-09-23 LAB — POC COVID19 BINAXNOW: SARS Coronavirus 2 Ag: NEGATIVE

## 2022-09-23 LAB — POCT INFLUENZA A/B
Influenza A, POC: POSITIVE — AB
Influenza B, POC: POSITIVE — AB

## 2022-09-23 MED ORDER — BENZONATATE 100 MG PO CAPS: 100.0000 mg | ORAL_CAPSULE | Freq: Three times a day (TID) | ORAL | 0 refills | Status: AC | PRN

## 2022-09-23 MED ORDER — FLUTICASONE PROPIONATE 50 MCG/ACT NA SUSP: 2.0000 | Freq: Every day | NASAL | 1 refills | Status: AC

## 2022-09-23 MED ORDER — OSELTAMIVIR PHOSPHATE 75 MG PO CAPS: 75.0000 mg | ORAL_CAPSULE | Freq: Two times a day (BID) | ORAL | 0 refills | Status: AC

## 2022-09-23 NOTE — Progress Notes (Signed)
Subjective:    Patient ID: Melvin Delgado, male    DOB: [birth year], 56 y.o.   MRN: [id number]  HPI . Pt has rapid acute onset of illness days 1 ago. Reports fever, body aches, fatigue , ,runny nose and  dry cough. Noo wheezing. No chest congestion.    Htn- no cardio or neurologic signs/symptoms.  On chlorthalidone 25 mg daily, clonidine 0.1 twice daily, hydralazine 100 mg tid and valsartan 320 mg daily   Review of Systems  Constitutional:  Positive for fatigue and fever. Negative for chills.  HENT:  Positive for congestion. Negative for sinus pressure, sinus pain, sneezing and sore throat.   Respiratory:  Negative for cough, chest tightness and wheezing.   Cardiovascular:  Negative for chest pain and palpitations.  Gastrointestinal:  Negative for abdominal pain.  Musculoskeletal:  Positive for myalgias. Negative for neck pain and neck stiffness.  Neurological:  Negative for dizziness, seizures and light-headedness.  Hematological:  Negative for adenopathy. Does not bruise/bleed easily.  Psychiatric/Behavioral:  Negative for behavioral problems and decreased concentration.    Past Medical History:  Diagnosis Date   Diabetes mellitus without complication (HCC)    High cholesterol    Hypertension      Social History   Socioeconomic History   Marital status: Single    Spouse name: Not on file   Number of children: Not on file   Years of education: Not on file   Highest education level: Not on file  Occupational History   Not on file  Tobacco Use   Smoking status: Every Day    Packs/day: 0.50    Types: Cigarettes    Start date: 06/15/1985   Smokeless tobacco: Never  Vaping Use   Vaping Use: Never used  Substance and Sexual Activity   Alcohol use: Yes    Comment: 1 every 2-3 weeks   Drug use: Never   Sexual activity: Not on file  Other Topics Concern   Not on file  Social History Narrative   Not on file   Social Determinants of Health   Financial Resource Strain:  Not on file  Food Insecurity: Not on file  Transportation Needs: Not on file  Physical Activity: Not on file  Stress: Not on file  Social Connections: Not on file  Intimate Partner Violence: Not on file    Past Surgical History:  Procedure Laterality Date   SHOULDER ARTHROSCOPY Left     Family History  Problem Relation Age of Onset   Osteoporosis Mother     No Known Allergies  Current Outpatient Medications on File Prior to Visit  Medication Sig Dispense Refill   atorvastatin (LIPITOR) 10 MG tablet Take 1 tablet (10 mg total) by mouth daily. 90 tablet 3   cephALEXin (KEFLEX) 500 MG capsule Take 1 capsule (500 mg total) by mouth 2 (two) times daily. 20 capsule 0   chlorthalidone (HYGROTON) 25 MG tablet Take 1 tablet (25 mg total) by mouth daily. 90 tablet 3   cloNIDine (CATAPRES) 0.1 MG tablet Take 1 tablet (0.1 mg total) by mouth 2 (two) times daily. 60 tablet 0   colchicine 0.6 MG tablet Take 1 tablet (0.6 mg total) by mouth 2 (two) times daily. 60 tablet 0   cyclobenzaprine (FLEXERIL) 5 MG tablet Take 1 tablet (5 mg total) by mouth at bedtime. 4 tablet 0   famotidine (PEPCID) 20 MG tablet Take 1 tablet (20 mg total) by mouth daily. 30 tablet 0   fluticasone (FLONASE) 50 MCG/ACT  nasal spray Place 2 sprays into both nostrils daily. 16 g 1   glipiZIDE (GLUCOTROL XL) 5 MG 24 hr tablet Take 1 tablet (5 mg total) by mouth daily with breakfast. 30 tablet 3   hydrALAZINE (APRESOLINE) 100 MG tablet Take 1 tablet (100 mg total) by mouth 3 (three) times daily. 90 tablet 3   metFORMIN (GLUCOPHAGE) 850 MG tablet Take 1 tablet (850 mg total) by mouth 2 (two) times daily with a meal. 180 tablet 3   methylPREDNISolone (MEDROL) 4 MG tablet 3 tab po day 1, 2 tab po day 2 and 1 tab po day 3 6 tablet 0   metoprolol succinate (TOPROL-XL) 50 MG 24 hr tablet Take 1 tablet (50 mg total) by mouth daily. 30 tablet 3   nystatin cream (MYCOSTATIN) Apply 1 application topically 2 (two) times daily. 30 g 0    saxagliptin HCl (ONGLYZA) 5 MG TABS tablet Take 1 tablet (5 mg total) by mouth daily. 30 tablet 11   valsartan (DIOVAN) 320 MG tablet Take 1 tablet (320 mg total) by mouth daily. 90 tablet 3   Current Facility-Administered Medications on File Prior to Visit  Medication Dose Route Frequency Provider Last Rate Last Admin   cyanocobalamin (VITAMIN B12) injection 1,000 mcg  1,000 mcg Intramuscular Q30 days Esperanza Richters, PA-C   1,000 mcg at 06/12/22 4098   cyanocobalamin (VITAMIN B12) injection 1,000 mcg  1,000 mcg Intramuscular Q30 days Esperanza Richters, PA-C   1,000 mcg at 05/29/22 0925    BP (!) 155/88   Pulse 95   Temp 99.9 F (37.7 C)   Resp 18   Ht 6\' 1"  (1.854 m)   Wt 288 lb (130.6 kg)   SpO2 96%   BMI 38.00 kg/m        Objective:   Physical Exam  General- No acute distress. Pleasant patient. Neck- Full range of motion, no jvd Lungs- Clear, even and unlabored. Heart- regular rate and rhythm. Neurologic- CNII- XII grossly intact.  Heent- no frontal or maxillary sinus pressure.mild dull tm bilateral with faint central redness       Assessment & Plan:   Patient Instructions  You have the flu. I am treating you with tamiflu. Rest, hydrate and take tylenol for fever.  Start tamiflu. Not to use ibuprofen or alleve since nsaids can raise your blood pressure You should gradually improve but  if you develop secondary bacterial infection signs and symptoms  please notify so reevaluation or antibiotic can be prescribed.  For nasal congestion rx flonase. On exam mild dull tm bilateral. If you have any ear pain let me know in that event would rx antibiotic as well.  Htn- bp better on recheck. Continue multi-medication regimen.  Follow up as regularly scheduled but sooner if flu signs/symptoms don't resolve or if worsen.  Return to work Friday if feeling better with no fever.        Monday, PA-C

## 2022-09-23 NOTE — Patient Instructions (Addendum)
You have the flu. I am treating you with tamiflu. Rest, hydrate and take tylenol for fever.  Start tamiflu. Not to use ibuprofen or alleve since nsaids can raise your blood pressure You should gradually improve but  if you develop secondary bacterial infection signs and symptoms  please notify us so reevaluation or antibiotic can be prescribed.  For nasal congestion rx flonase. On exam mild dull tm bilateral. If you have any ear pain let me know in that event would rx antibiotic as well.  Htn- bp better on recheck. Continue multi-medication regimen.  Follow up as regularly scheduled but sooner if flu signs/symptoms don't resolve or if worsen.  Return to work Friday if feeling better with no fever.

## 2022-10-10 ENCOUNTER — Encounter: Payer: Self-pay | Admitting: Podiatry

## 2022-10-10 NOTE — Telephone Encounter (Signed)
Left message on voicemail for pt to call back to schedule appt to pick up orthotics   3rd attempt mailing letter to patient

## 2023-01-05 IMAGING — CT CT ABD-PELV W/ CM
2 of 5 series · 16 of 46 positions shown, 18 images · IV contrast (Omnipaque)
Comparison: None Available.

CLINICAL DATA: LUQ abdominal pain persisting abdome pain. Evaluate
for diverticultis.

EXAM:
CT ABDOMEN AND PELVIS WITH CONTRAST
TECHNIQUE: Multidetector CT imaging of the abdomen and pelvis was performed
using the standard protocol following bolus administration of
intravenous contrast.

[Series 2: axial st · axial · 0.98mm/px · z∈[-574,-38]mm · 13 of 121 slices shown, 15 images]
[im 7/121  soft-tissue]
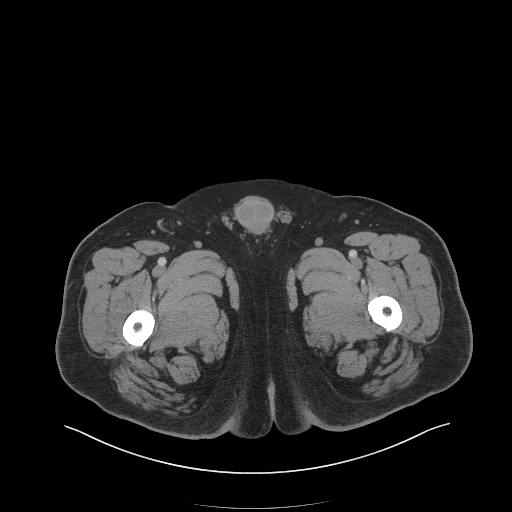
[im 7/121  bone]
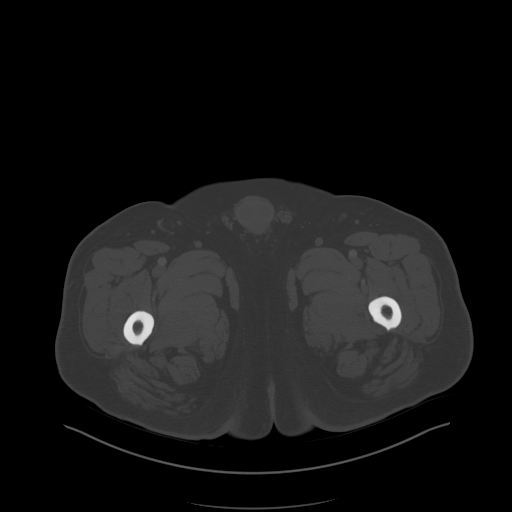
[im 19/121  soft-tissue]
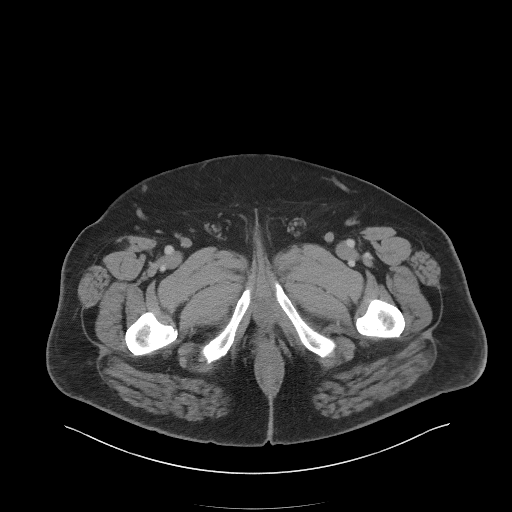
[im 26/121  soft-tissue]
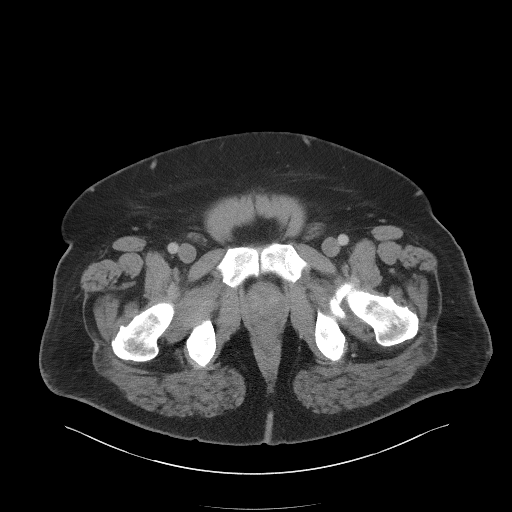
[im 32/121  soft-tissue]
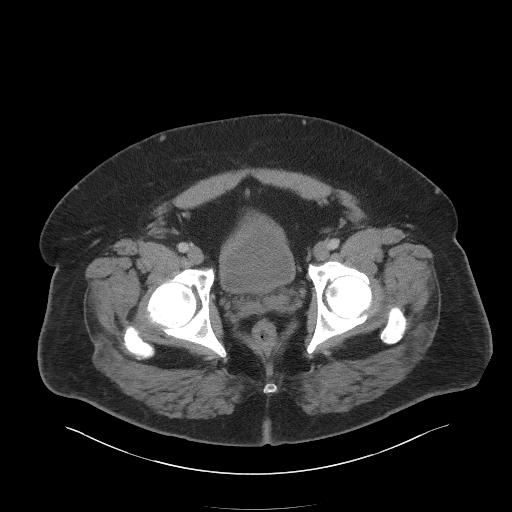
[im 45/121  soft-tissue]
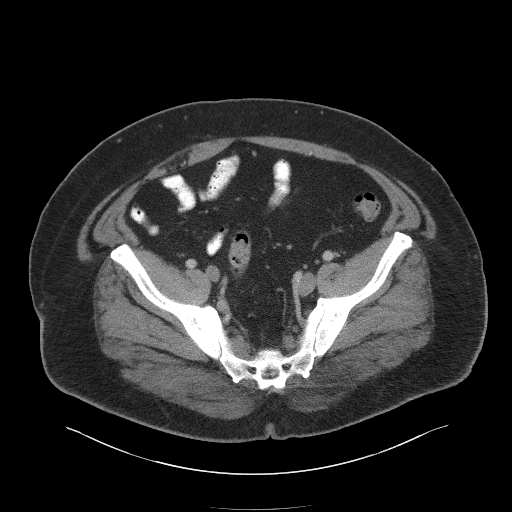
[im 51/121  soft-tissue]
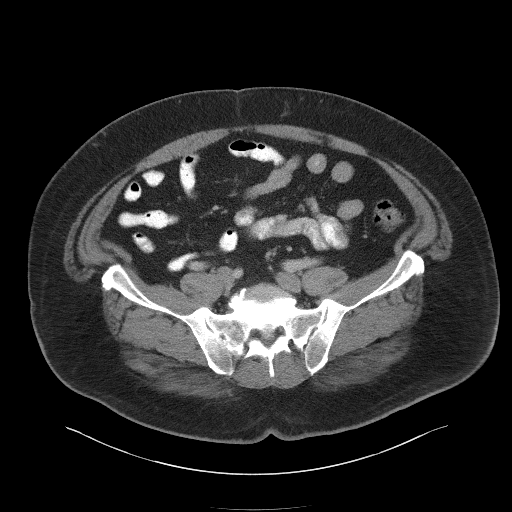
[im 64/121  soft-tissue]
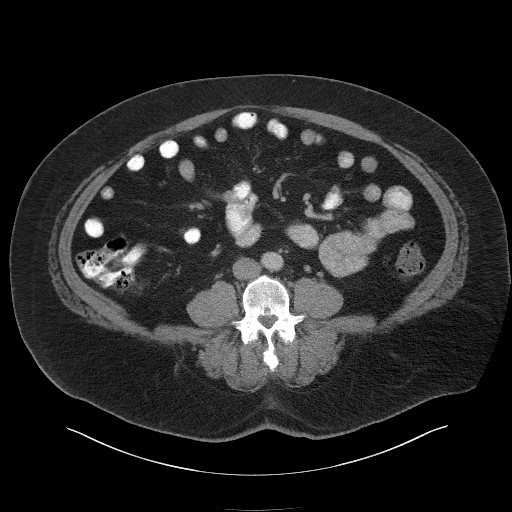
[im 70/121  soft-tissue]
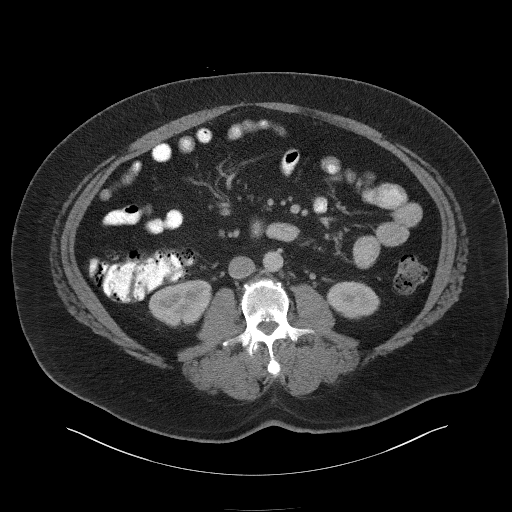
[im 76/121  soft-tissue]
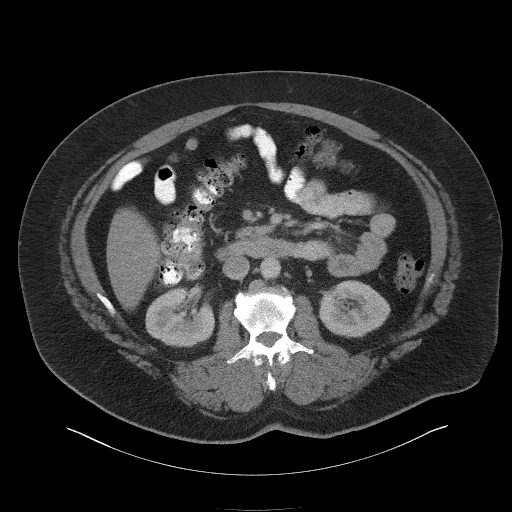
[im 76/121  bone]
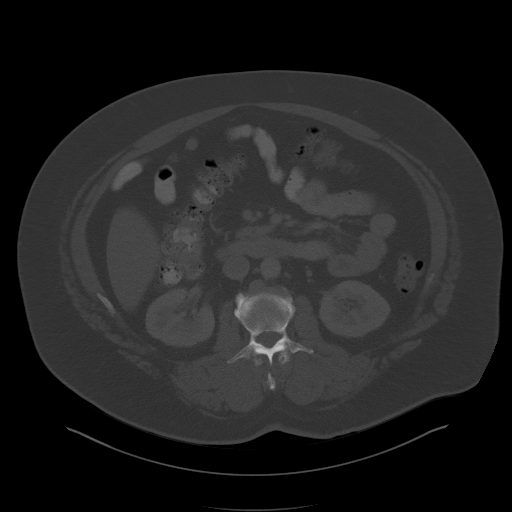
[im 89/121  soft-tissue]
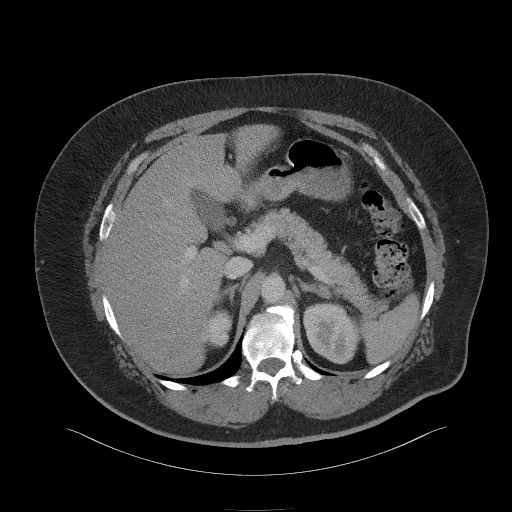
[im 95/121  soft-tissue]
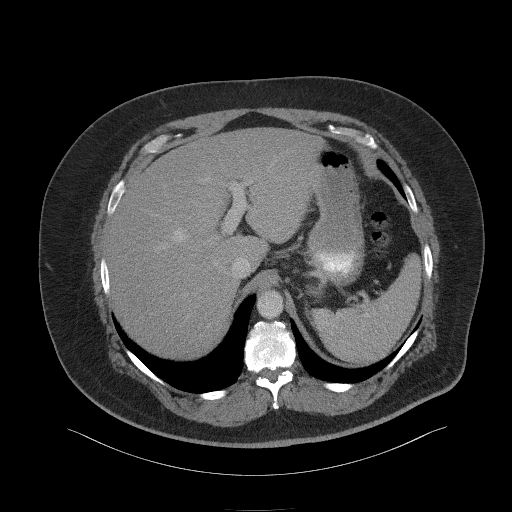
[im 102/121  soft-tissue]
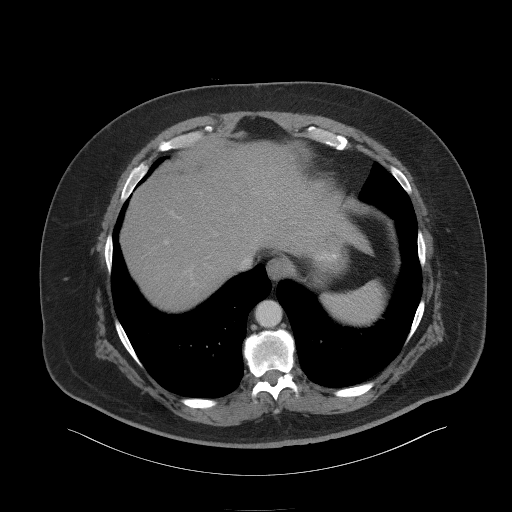
[im 114/121  soft-tissue]
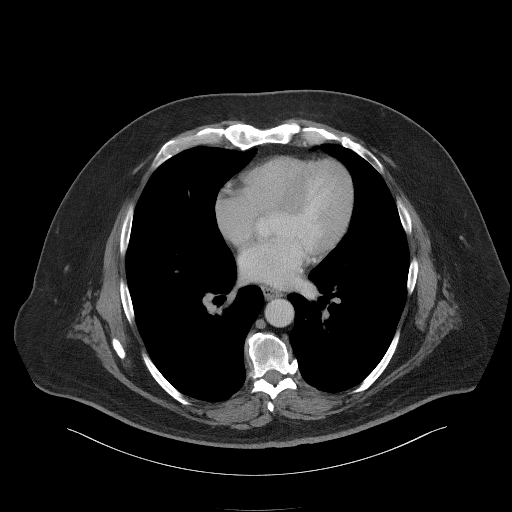

[Series 5: coronal st · coronal · 1.04mm/px · 3 of 122 slices shown]
[im 41/122  soft-tissue]
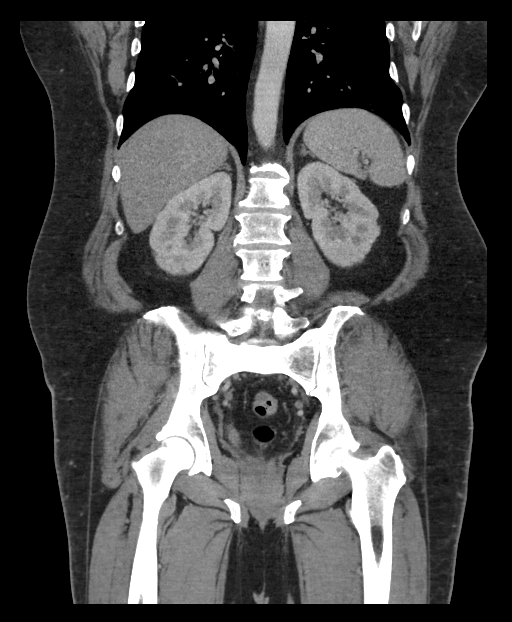
[im 54/122  soft-tissue]
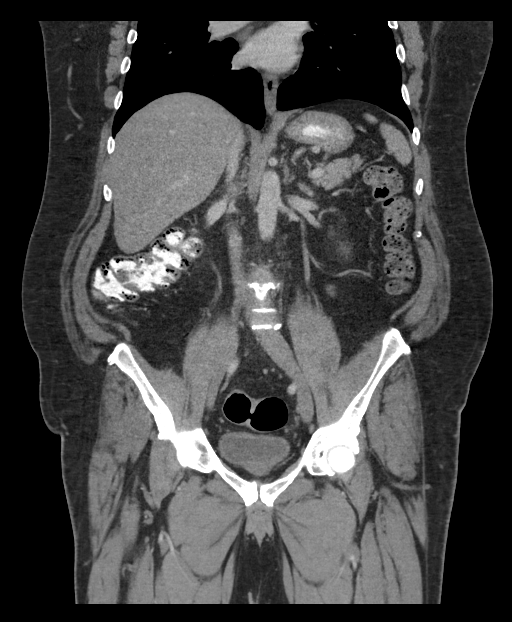
[im 68/122  soft-tissue]
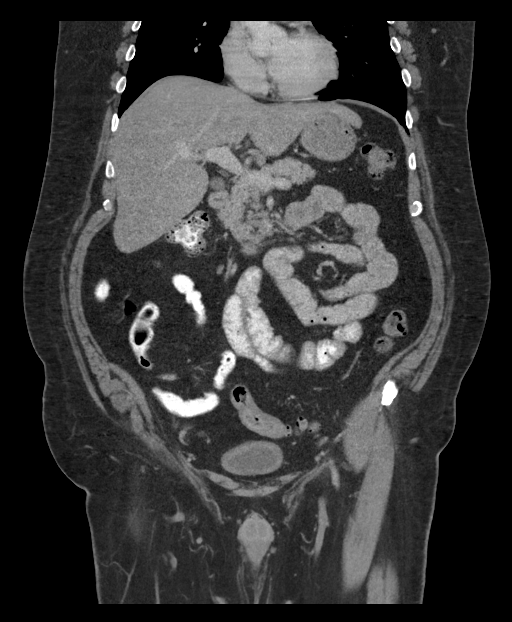

[16 of 46 positions shown; findings below may reference images not displayed]

RADIATION DOSE REDUCTION: This exam was performed according to the
departmental dose-optimization program which includes automated
exposure control, adjustment of the mA and/or kV according to
patient size and/or use of iterative reconstruction technique.

CONTRAST:  100mL OMNIPAQUE IOHEXOL 300 MG/ML  SOLN
FINDINGS: Lower chest: No acute abnormality.6

Hepatobiliary: No focal liver abnormality is seen. Moderate hepatic
steatosis. No gallstones, gallbladder wall thickening, or biliary
dilatation.

Pancreas: Unremarkable. No pancreatic ductal dilatation or
surrounding inflammatory changes.

Spleen: Normal in size without focal abnormality.

Adrenals/Urinary Tract: Adrenal glands are unremarkable. Kidneys are
normal, without renal calculi, focal lesion, or hydronephrosis.
Bladder is unremarkable.

Stomach/Bowel: Stomach is within normal limits. Appendix is normal.
No evidence of bowel wall thickening, distention, or inflammatory
changes. Moderate diverticulosis of the sigmoid colon without
diverticulitis. Moderate stool burden in the colon.

Vascular/Lymphatic: No significant vascular findings are present. No
enlarged abdominal or pelvic lymph nodes.

Reproductive: Prostate is unremarkable.

Other: No abdominal wall hernia or abnormality. No abdominopelvic
ascites.

Musculoskeletal: No acute or significant osseous findings.
IMPRESSION: Moderate diverticulosis of the sigmoid colon without diverticulitis.
Moderate stool burden in the colon of constipation. Bowel-gas
pattern is nonobstructive.

No free air, fluid, inflammatory changes, mass or significant
lymphadenopathy in the abdomen pelvis.

## 2023-03-27 ENCOUNTER — Telehealth: Payer: Self-pay | Admitting: Podiatry

## 2023-03-27 NOTE — Telephone Encounter (Signed)
Unable to reach pt to schedule appt for PUO.
